# Patient Record
Sex: Female | Born: 1945 | Race: White | Hispanic: No | Marital: Married | Smoking: Never smoker
Health system: Southern US, Community
[De-identification: ages and names within clinical notes are randomized; demographics above are authoritative.]

## PROBLEM LIST (undated history)

## (undated) DIAGNOSIS — E559 Vitamin D deficiency, unspecified: Secondary | ICD-10-CM

## (undated) DIAGNOSIS — K219 Gastro-esophageal reflux disease without esophagitis: Secondary | ICD-10-CM

## (undated) DIAGNOSIS — E039 Hypothyroidism, unspecified: Secondary | ICD-10-CM

## (undated) DIAGNOSIS — R7303 Prediabetes: Secondary | ICD-10-CM

## (undated) DIAGNOSIS — C801 Malignant (primary) neoplasm, unspecified: Secondary | ICD-10-CM

## (undated) DIAGNOSIS — I639 Cerebral infarction, unspecified: Secondary | ICD-10-CM

## (undated) HISTORY — DX: Cerebral infarction, unspecified: I63.9

## (undated) HISTORY — PX: ABDOMINAL HYSTERECTOMY: SHX81

## (undated) HISTORY — PX: TONSILLECTOMY: SUR1361

## (undated) HISTORY — PX: HYSTERECTOMY ABDOMINAL WITH SALPINGO-OOPHORECTOMY: SHX6792

---

## 1997-09-24 ENCOUNTER — Ambulatory Visit (HOSPITAL_COMMUNITY): Admission: RE | Admit: 1997-09-24 | Discharge: 1997-09-24 | Payer: Self-pay | Admitting: Gynecology

## 1997-10-01 ENCOUNTER — Ambulatory Visit: Admission: RE | Admit: 1997-10-01 | Discharge: 1997-10-01 | Payer: Self-pay | Admitting: Gynecology

## 1998-05-01 ENCOUNTER — Ambulatory Visit (HOSPITAL_COMMUNITY): Admission: RE | Admit: 1998-05-01 | Discharge: 1998-05-01 | Payer: Self-pay | Admitting: Gynecology

## 1998-05-05 ENCOUNTER — Ambulatory Visit: Admission: RE | Admit: 1998-05-05 | Discharge: 1998-05-05 | Payer: Self-pay | Admitting: Gynecology

## 1998-09-23 ENCOUNTER — Ambulatory Visit (HOSPITAL_COMMUNITY): Admission: RE | Admit: 1998-09-23 | Discharge: 1998-09-23 | Payer: Self-pay | Admitting: Gastroenterology

## 1999-01-13 ENCOUNTER — Encounter: Admission: RE | Admit: 1999-01-13 | Discharge: 1999-01-13 | Payer: Self-pay | Admitting: Hematology and Oncology

## 1999-01-13 ENCOUNTER — Encounter: Payer: Self-pay | Admitting: Hematology and Oncology

## 1999-02-16 ENCOUNTER — Encounter: Payer: Self-pay | Admitting: Gastroenterology

## 1999-02-16 ENCOUNTER — Encounter: Admission: RE | Admit: 1999-02-16 | Discharge: 1999-02-16 | Payer: Self-pay | Admitting: Gastroenterology

## 1999-07-14 ENCOUNTER — Ambulatory Visit: Admission: RE | Admit: 1999-07-14 | Discharge: 1999-07-14 | Payer: Self-pay | Admitting: Gynecology

## 1999-07-14 ENCOUNTER — Other Ambulatory Visit: Admission: RE | Admit: 1999-07-14 | Discharge: 1999-07-14 | Payer: Self-pay | Admitting: Gynecology

## 1999-09-30 ENCOUNTER — Encounter: Admission: RE | Admit: 1999-09-30 | Discharge: 1999-09-30 | Payer: Self-pay | Admitting: Internal Medicine

## 1999-09-30 ENCOUNTER — Encounter: Payer: Self-pay | Admitting: Internal Medicine

## 2000-01-12 ENCOUNTER — Other Ambulatory Visit: Admission: RE | Admit: 2000-01-12 | Discharge: 2000-01-12 | Payer: Self-pay | Admitting: Gynecology

## 2000-01-12 ENCOUNTER — Ambulatory Visit: Admission: RE | Admit: 2000-01-12 | Discharge: 2000-01-12 | Payer: Self-pay | Admitting: Gynecology

## 2001-03-29 ENCOUNTER — Other Ambulatory Visit: Admission: RE | Admit: 2001-03-29 | Discharge: 2001-03-29 | Payer: Self-pay | Admitting: Obstetrics and Gynecology

## 2001-04-09 ENCOUNTER — Encounter: Payer: Self-pay | Admitting: Obstetrics and Gynecology

## 2001-04-09 ENCOUNTER — Encounter: Admission: RE | Admit: 2001-04-09 | Discharge: 2001-04-09 | Payer: Self-pay | Admitting: Obstetrics and Gynecology

## 2002-04-10 ENCOUNTER — Encounter: Payer: Self-pay | Admitting: Obstetrics and Gynecology

## 2002-04-10 ENCOUNTER — Encounter: Admission: RE | Admit: 2002-04-10 | Discharge: 2002-04-10 | Payer: Self-pay | Admitting: Obstetrics and Gynecology

## 2002-11-23 ENCOUNTER — Emergency Department (HOSPITAL_COMMUNITY): Admission: EM | Admit: 2002-11-23 | Discharge: 2002-11-23 | Payer: Self-pay | Admitting: Emergency Medicine

## 2003-07-03 ENCOUNTER — Emergency Department (HOSPITAL_COMMUNITY): Admission: EM | Admit: 2003-07-03 | Discharge: 2003-07-03 | Payer: Self-pay | Admitting: Family Medicine

## 2004-12-17 ENCOUNTER — Encounter: Admission: RE | Admit: 2004-12-17 | Discharge: 2004-12-17 | Payer: Self-pay | Admitting: Obstetrics and Gynecology

## 2006-02-14 ENCOUNTER — Encounter: Admission: RE | Admit: 2006-02-14 | Discharge: 2006-02-14 | Payer: Self-pay | Admitting: Obstetrics and Gynecology

## 2007-02-19 ENCOUNTER — Encounter: Admission: RE | Admit: 2007-02-19 | Discharge: 2007-02-19 | Payer: Self-pay | Admitting: Obstetrics and Gynecology

## 2007-05-21 ENCOUNTER — Emergency Department (HOSPITAL_COMMUNITY): Admission: EM | Admit: 2007-05-21 | Discharge: 2007-05-21 | Payer: Self-pay | Admitting: Family Medicine

## 2008-02-21 ENCOUNTER — Encounter: Admission: RE | Admit: 2008-02-21 | Discharge: 2008-02-21 | Payer: Self-pay | Admitting: Internal Medicine

## 2008-06-20 ENCOUNTER — Ambulatory Visit (HOSPITAL_COMMUNITY): Admission: RE | Admit: 2008-06-20 | Discharge: 2008-06-20 | Payer: Self-pay | Admitting: Internal Medicine

## 2008-10-30 ENCOUNTER — Ambulatory Visit (HOSPITAL_COMMUNITY): Admission: RE | Admit: 2008-10-30 | Discharge: 2008-10-30 | Payer: Self-pay | Admitting: Gastroenterology

## 2010-02-03 ENCOUNTER — Encounter: Admission: RE | Admit: 2010-02-03 | Discharge: 2010-02-03 | Payer: Self-pay | Admitting: Internal Medicine

## 2010-08-03 NOTE — Op Note (Signed)
Alexandra Powell, Alexandra Powell             ACCOUNT NO.:  [id number]   MEDICAL RECORD NO.:  [id number]          PATIENT TYPE:  AMB   LOCATION:  ENDO                         FACILITY:  [facility]   PHYSICIAN:  Bernette Redbird, M.D.   DATE OF BIRTH:  05/04/1945   DATE OF PROCEDURE:  10/30/2008  DATE OF DISCHARGE:                               OPERATIVE REPORT   PROCEDURE:  Colonoscopy   ENDOSCOPIST:  Bernette Redbird, M.D.   INDICATIONS:  Screening for colon cancer in a 65 year old female with  intermittent bowel obstruction-type symptoms and a family history of  colon cancer in her father at around  age 43.   FINDINGS:  Normal exam to the cecum.   DESCRIPTION OF PROCEDURE:  The nature, purpose and risks of the  procedure were familiar to the patient from a prior examination, and she  provided a written consent.  The Pentax adult video colonoscope was  advanced with some difficulty around the colon, facilitated by turning  the patient into the supine position and applying external abdominal  compression of her ventral hernia.  Ultimately the cecum was reached as  evidenced by visualization of the appendiceal orifice, and pullback was  then performed.  The quality of prep was excellent, and it is felt that  all areas were well seen.   This was a normal examination.  No polyps, cancer, colitis, diverticular  disease, or vascular malformations were noted.  Retroflexion in the  rectum and re-inspection in the rectum were unremarkable.  No biopsies  were obtained.   She tolerated the procedure well and there no apparent complications.   IMPRESSION:  Normal screening colonoscopy in a patient with a family  history of colon cancer.   PLAN:  Repeat colonoscopy in five years.           ______________________________  Bernette Redbird, M.D.     RB/MEDQ  D:  10/30/2008  T:  10/30/2008  Job:  [id number]   cc:   Juline Patch, M.D.  Fax: [phone]

## 2010-08-06 NOTE — Consult Note (Signed)
[facility]  Patient:    Alexandra, Powell                    MRN: [id number] Proc. Date: 01/12/00 Adm. Date:  [id number] Disc. Date: [id number] Attending:  Jeannette Corpus CC:         Telford Nab, R.N.  Florencia Reasons, M.D.  Warrick Parisian, M.D.  Harl Bowie, M.D.   Consultation Report  GYNECOLOGIC ONCOLOGY CLINIC  A 65 year old white female returns for continuing followup.  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  She received chemotherapy for treatment of her ovarian cancer using carboplatin and Taxol.  The patient has done well since completing chemotherapy and has remained without any evidence of disease.  Since her last visit, she has had no GI or GU symptoms except for those attributed to irritable bowel syndrome, managed by Dr. Katy Fitch. Buccini.  She denies any pelvic pressure or any breast symptoms or any cardiovascular or pulmonary symptoms.  REVIEW OF SYSTEMS:  Negative.  FAMILY HISTORY AND SOCIAL HISTORY:  Reviewed and unchanged from previous notations.  PHYSICAL EXAMINATION  VITAL SIGNS:  Weight 255 pounds (stable), blood pressure 108/80.  GENERAL:  The patient is a pleasant white female in no acute distress.  HEENT:  Negative.  NECK:  Supple without thyromegaly.  LYMPHATICS:  There is no supraclavicular, axillary or inguinal adenopathy.  BREASTS:  Without masses, discharge or skin changes.  ABDOMEN:  Obese, soft, nontender.  No masses, organomegaly, ascites or herniae are noted.  Her incision is well-healed.  PELVIC:  EG/BUS normal.  Vagina is clean, well-supported and no lesions are noted.  Bimanual and rectovaginal exam revealed no masses, induration or nodularity.  IMPRESSION:  Stage IIC ovarian cancer, stage IB endometrial cancer, 1996; no evidence of recurrent disease.  PLAN:  Pap smears were  obtained.  CA125 is obtained (return value is 5.4 U/ml).  The patient will return to see me in one year.  It is also noted that she had mammograms recently and they were negative. DD:  01/25/00 TD:  01/25/00 Job: [id number]

## 2012-05-29 DIAGNOSIS — R509 Fever, unspecified: Secondary | ICD-10-CM | POA: Diagnosis not present

## 2012-05-29 DIAGNOSIS — R300 Dysuria: Secondary | ICD-10-CM | POA: Diagnosis not present

## 2012-07-17 DIAGNOSIS — E7800 Pure hypercholesterolemia, unspecified: Secondary | ICD-10-CM | POA: Diagnosis not present

## 2012-12-24 DIAGNOSIS — Z23 Encounter for immunization: Secondary | ICD-10-CM | POA: Diagnosis not present

## 2013-01-17 DIAGNOSIS — E039 Hypothyroidism, unspecified: Secondary | ICD-10-CM | POA: Diagnosis not present

## 2013-01-17 DIAGNOSIS — Z0000 Encounter for general adult medical examination without abnormal findings: Secondary | ICD-10-CM | POA: Diagnosis not present

## 2013-01-17 DIAGNOSIS — E7800 Pure hypercholesterolemia, unspecified: Secondary | ICD-10-CM | POA: Diagnosis not present

## 2013-01-17 DIAGNOSIS — E559 Vitamin D deficiency, unspecified: Secondary | ICD-10-CM | POA: Diagnosis not present

## 2013-01-17 DIAGNOSIS — C569 Malignant neoplasm of unspecified ovary: Secondary | ICD-10-CM | POA: Diagnosis not present

## 2013-01-21 DIAGNOSIS — Z0000 Encounter for general adult medical examination without abnormal findings: Secondary | ICD-10-CM | POA: Diagnosis not present

## 2013-01-21 DIAGNOSIS — M899 Disorder of bone, unspecified: Secondary | ICD-10-CM | POA: Diagnosis not present

## 2013-01-21 DIAGNOSIS — E7800 Pure hypercholesterolemia, unspecified: Secondary | ICD-10-CM | POA: Diagnosis not present

## 2013-01-21 DIAGNOSIS — E559 Vitamin D deficiency, unspecified: Secondary | ICD-10-CM | POA: Diagnosis not present

## 2013-01-21 DIAGNOSIS — Z1331 Encounter for screening for depression: Secondary | ICD-10-CM | POA: Diagnosis not present

## 2013-07-25 DIAGNOSIS — E7800 Pure hypercholesterolemia, unspecified: Secondary | ICD-10-CM | POA: Diagnosis not present

## 2013-08-01 DIAGNOSIS — E039 Hypothyroidism, unspecified: Secondary | ICD-10-CM | POA: Diagnosis not present

## 2013-08-01 DIAGNOSIS — E785 Hyperlipidemia, unspecified: Secondary | ICD-10-CM | POA: Diagnosis not present

## 2013-08-01 DIAGNOSIS — Z0000 Encounter for general adult medical examination without abnormal findings: Secondary | ICD-10-CM | POA: Diagnosis not present

## 2013-08-01 DIAGNOSIS — Z1331 Encounter for screening for depression: Secondary | ICD-10-CM | POA: Diagnosis not present

## 2013-11-14 DIAGNOSIS — H1045 Other chronic allergic conjunctivitis: Secondary | ICD-10-CM | POA: Diagnosis not present

## 2013-11-14 DIAGNOSIS — H04129 Dry eye syndrome of unspecified lacrimal gland: Secondary | ICD-10-CM | POA: Diagnosis not present

## 2013-11-14 DIAGNOSIS — H2510 Age-related nuclear cataract, unspecified eye: Secondary | ICD-10-CM | POA: Diagnosis not present

## 2014-02-18 DIAGNOSIS — E559 Vitamin D deficiency, unspecified: Secondary | ICD-10-CM | POA: Diagnosis not present

## 2014-02-18 DIAGNOSIS — E785 Hyperlipidemia, unspecified: Secondary | ICD-10-CM | POA: Diagnosis not present

## 2014-02-18 DIAGNOSIS — C569 Malignant neoplasm of unspecified ovary: Secondary | ICD-10-CM | POA: Diagnosis not present

## 2014-02-18 DIAGNOSIS — E039 Hypothyroidism, unspecified: Secondary | ICD-10-CM | POA: Diagnosis not present

## 2014-02-18 DIAGNOSIS — E780 Pure hypercholesterolemia: Secondary | ICD-10-CM | POA: Diagnosis not present

## 2014-03-05 DIAGNOSIS — K219 Gastro-esophageal reflux disease without esophagitis: Secondary | ICD-10-CM | POA: Diagnosis not present

## 2014-03-05 DIAGNOSIS — E780 Pure hypercholesterolemia: Secondary | ICD-10-CM | POA: Diagnosis not present

## 2014-03-05 DIAGNOSIS — E039 Hypothyroidism, unspecified: Secondary | ICD-10-CM | POA: Diagnosis not present

## 2014-03-05 DIAGNOSIS — E6609 Other obesity due to excess calories: Secondary | ICD-10-CM | POA: Diagnosis not present

## 2014-03-23 DIAGNOSIS — J019 Acute sinusitis, unspecified: Secondary | ICD-10-CM | POA: Diagnosis not present

## 2014-09-15 DIAGNOSIS — K219 Gastro-esophageal reflux disease without esophagitis: Secondary | ICD-10-CM | POA: Diagnosis not present

## 2014-09-15 DIAGNOSIS — E780 Pure hypercholesterolemia: Secondary | ICD-10-CM | POA: Diagnosis not present

## 2014-09-15 DIAGNOSIS — E039 Hypothyroidism, unspecified: Secondary | ICD-10-CM | POA: Diagnosis not present

## 2014-09-15 DIAGNOSIS — E785 Hyperlipidemia, unspecified: Secondary | ICD-10-CM | POA: Diagnosis not present

## 2014-09-15 DIAGNOSIS — E559 Vitamin D deficiency, unspecified: Secondary | ICD-10-CM | POA: Diagnosis not present

## 2015-01-08 ENCOUNTER — Ambulatory Visit (HOSPITAL_COMMUNITY)
Admission: RE | Admit: 2015-01-08 | Discharge: 2015-01-08 | Disposition: A | Discharge status: Home / Self Care | Payer: Medicare Other | Source: Ambulatory Visit | Attending: Gastroenterology | Admitting: Gastroenterology

## 2015-01-08 ENCOUNTER — Other Ambulatory Visit (HOSPITAL_COMMUNITY): Payer: Self-pay | Admitting: Gastroenterology

## 2015-01-08 DIAGNOSIS — K429 Umbilical hernia without obstruction or gangrene: Secondary | ICD-10-CM | POA: Insufficient documentation

## 2015-01-08 DIAGNOSIS — Z1211 Encounter for screening for malignant neoplasm of colon: Secondary | ICD-10-CM | POA: Diagnosis not present

## 2015-01-08 DIAGNOSIS — K5669 Other intestinal obstruction: Secondary | ICD-10-CM | POA: Diagnosis not present

## 2015-01-08 DIAGNOSIS — Z800 Family history of malignant neoplasm of digestive organs: Secondary | ICD-10-CM

## 2015-01-08 DIAGNOSIS — Z538 Procedure and treatment not carried out for other reasons: Secondary | ICD-10-CM | POA: Diagnosis not present

## 2015-03-10 DIAGNOSIS — E785 Hyperlipidemia, unspecified: Secondary | ICD-10-CM | POA: Diagnosis not present

## 2015-03-10 DIAGNOSIS — E559 Vitamin D deficiency, unspecified: Secondary | ICD-10-CM | POA: Diagnosis not present

## 2015-03-10 DIAGNOSIS — Z8543 Personal history of malignant neoplasm of ovary: Secondary | ICD-10-CM | POA: Diagnosis not present

## 2015-03-10 DIAGNOSIS — Z13820 Encounter for screening for osteoporosis: Secondary | ICD-10-CM | POA: Diagnosis not present

## 2015-03-10 DIAGNOSIS — Z23 Encounter for immunization: Secondary | ICD-10-CM | POA: Diagnosis not present

## 2015-03-10 DIAGNOSIS — E039 Hypothyroidism, unspecified: Secondary | ICD-10-CM | POA: Diagnosis not present

## 2015-03-10 DIAGNOSIS — K219 Gastro-esophageal reflux disease without esophagitis: Secondary | ICD-10-CM | POA: Diagnosis not present

## 2015-03-10 DIAGNOSIS — Z0000 Encounter for general adult medical examination without abnormal findings: Secondary | ICD-10-CM | POA: Diagnosis not present

## 2015-03-17 DIAGNOSIS — E785 Hyperlipidemia, unspecified: Secondary | ICD-10-CM | POA: Diagnosis not present

## 2015-03-17 DIAGNOSIS — E039 Hypothyroidism, unspecified: Secondary | ICD-10-CM | POA: Diagnosis not present

## 2015-03-17 DIAGNOSIS — E559 Vitamin D deficiency, unspecified: Secondary | ICD-10-CM | POA: Diagnosis not present

## 2015-03-17 DIAGNOSIS — N182 Chronic kidney disease, stage 2 (mild): Secondary | ICD-10-CM | POA: Diagnosis not present

## 2015-05-11 ENCOUNTER — Other Ambulatory Visit: Payer: Self-pay

## 2015-05-11 DIAGNOSIS — Z1231 Encounter for screening mammogram for malignant neoplasm of breast: Secondary | ICD-10-CM

## 2015-05-26 ENCOUNTER — Ambulatory Visit
Admission: RE | Admit: 2015-05-26 | Discharge: 2015-05-26 | Disposition: A | Discharge status: Home / Self Care | Payer: Medicare Other | Source: Ambulatory Visit

## 2015-05-26 DIAGNOSIS — Z1231 Encounter for screening mammogram for malignant neoplasm of breast: Secondary | ICD-10-CM

## 2015-09-08 DIAGNOSIS — E785 Hyperlipidemia, unspecified: Secondary | ICD-10-CM | POA: Diagnosis not present

## 2015-09-08 DIAGNOSIS — E559 Vitamin D deficiency, unspecified: Secondary | ICD-10-CM | POA: Diagnosis not present

## 2015-09-08 DIAGNOSIS — E039 Hypothyroidism, unspecified: Secondary | ICD-10-CM | POA: Diagnosis not present

## 2015-09-08 DIAGNOSIS — N182 Chronic kidney disease, stage 2 (mild): Secondary | ICD-10-CM | POA: Diagnosis not present

## 2015-09-10 DIAGNOSIS — M1711 Unilateral primary osteoarthritis, right knee: Secondary | ICD-10-CM | POA: Diagnosis not present

## 2015-09-15 DIAGNOSIS — E039 Hypothyroidism, unspecified: Secondary | ICD-10-CM | POA: Diagnosis not present

## 2015-09-15 DIAGNOSIS — N182 Chronic kidney disease, stage 2 (mild): Secondary | ICD-10-CM | POA: Diagnosis not present

## 2015-09-15 DIAGNOSIS — E785 Hyperlipidemia, unspecified: Secondary | ICD-10-CM | POA: Diagnosis not present

## 2016-01-12 DIAGNOSIS — H04123 Dry eye syndrome of bilateral lacrimal glands: Secondary | ICD-10-CM | POA: Diagnosis not present

## 2016-01-12 DIAGNOSIS — H25813 Combined forms of age-related cataract, bilateral: Secondary | ICD-10-CM | POA: Diagnosis not present

## 2016-02-23 DIAGNOSIS — M545 Low back pain: Secondary | ICD-10-CM | POA: Diagnosis not present

## 2016-02-23 DIAGNOSIS — M7072 Other bursitis of hip, left hip: Secondary | ICD-10-CM | POA: Diagnosis not present

## 2016-03-10 DIAGNOSIS — E039 Hypothyroidism, unspecified: Secondary | ICD-10-CM | POA: Diagnosis not present

## 2016-03-10 DIAGNOSIS — M8580 Other specified disorders of bone density and structure, unspecified site: Secondary | ICD-10-CM | POA: Diagnosis not present

## 2016-03-10 DIAGNOSIS — E785 Hyperlipidemia, unspecified: Secondary | ICD-10-CM | POA: Diagnosis not present

## 2016-03-10 DIAGNOSIS — Z8543 Personal history of malignant neoplasm of ovary: Secondary | ICD-10-CM | POA: Diagnosis not present

## 2016-03-10 DIAGNOSIS — N390 Urinary tract infection, site not specified: Secondary | ICD-10-CM | POA: Diagnosis not present

## 2016-03-22 DIAGNOSIS — N182 Chronic kidney disease, stage 2 (mild): Secondary | ICD-10-CM | POA: Diagnosis not present

## 2016-03-22 DIAGNOSIS — M8580 Other specified disorders of bone density and structure, unspecified site: Secondary | ICD-10-CM | POA: Diagnosis not present

## 2016-03-22 DIAGNOSIS — E785 Hyperlipidemia, unspecified: Secondary | ICD-10-CM | POA: Diagnosis not present

## 2016-03-22 DIAGNOSIS — E039 Hypothyroidism, unspecified: Secondary | ICD-10-CM | POA: Diagnosis not present

## 2016-05-25 ENCOUNTER — Emergency Department (HOSPITAL_COMMUNITY)
Admission: EM | Admit: 2016-05-25 | Discharge: 2016-05-25 | Disposition: A | Discharge status: Home / Self Care | Payer: Medicare Other | Attending: Emergency Medicine | Admitting: Emergency Medicine

## 2016-05-25 ENCOUNTER — Encounter (HOSPITAL_COMMUNITY): Payer: Self-pay | Admitting: Emergency Medicine

## 2016-05-25 ENCOUNTER — Emergency Department (HOSPITAL_COMMUNITY): Payer: Medicare Other

## 2016-05-25 DIAGNOSIS — M25511 Pain in right shoulder: Secondary | ICD-10-CM | POA: Diagnosis present

## 2016-05-25 DIAGNOSIS — W19XXXA Unspecified fall, initial encounter: Secondary | ICD-10-CM | POA: Diagnosis not present

## 2016-05-25 DIAGNOSIS — Y999 Unspecified external cause status: Secondary | ICD-10-CM | POA: Insufficient documentation

## 2016-05-25 DIAGNOSIS — Y92009 Unspecified place in unspecified non-institutional (private) residence as the place of occurrence of the external cause: Secondary | ICD-10-CM | POA: Insufficient documentation

## 2016-05-25 DIAGNOSIS — Y9389 Activity, other specified: Secondary | ICD-10-CM | POA: Diagnosis not present

## 2016-05-25 HISTORY — DX: Vitamin D deficiency, unspecified: E55.9

## 2016-05-25 HISTORY — DX: Gastro-esophageal reflux disease without esophagitis: K21.9

## 2016-05-25 MED ORDER — IBUPROFEN 600 MG PO TABS: 600.0000 mg | ORAL_TABLET | Freq: Four times a day (QID) | ORAL | 0 refills | Status: DC | PRN

## 2016-05-25 NOTE — ED Provider Notes (Signed)
[facility] DEPT Provider Note   CSN: [id number] Arrival date & time: 05/25/16  1759     History   Chief Complaint Chief Complaint  Patient presents with  . Shoulder Injury  . Shoulder Pain  . Fall    HPI Alexandra Powell is a 71 y.o. female.  HPI   Patient is a 71 year old female with history of GERD and thyroid disease who presents with complaint of right shoulder pain, onset 5:30 PM. Patient reports she was stepping backwards after placing something in a closet at home when she stumbled over her feet resulting in her falling on her right shoulder on the hardwood floor. Denies head injury or LOC. Patient reports having associated pain to her right shoulder that worsens with any movement. Denies radiation. Denies headache, dizziness, neck pain, back pain, chest pain, abdominal pain, numbness, tingling, weakness. Patient denies taking any medications for her symptoms. Denies use of anticoagulants. Denies prior injury or surgery to her right shoulder.  Past Medical History:  Diagnosis Date  . GERD (gastroesophageal reflux disease)   . Thyroid disease   . Vitamin D deficiency     There are no active problems to display for this patient.   History reviewed. No pertinent surgical history.  OB History    No data available       Home Medications    Prior to Admission medications   Medication Sig Start Date End Date Taking? Authorizing Provider  ibuprofen (ADVIL,MOTRIN) 600 MG tablet Take 1 tablet (600 mg total) by mouth every 6 (six) hours as needed. 05/25/16   Nona Dell, PA-C    Family History No family history on file.  Social History Social History  Substance Use Topics  . Smoking status: Never Smoker  . Smokeless tobacco: Never Used  . Alcohol use No     Allergies   Patient has no allergy information on record.   Review of Systems Review of Systems  Musculoskeletal: Positive for arthralgias (right shoulder).  All other systems reviewed  and are negative.    Physical Exam Updated Vital Signs BP 158/84   Pulse 76   Temp 98.6 F (37 C) (Oral)   Resp 17   Ht 5\' 2"  (1.575 m)   Wt 105.2 kg   SpO2 100%   BMI 42.43 kg/m   Physical Exam  Constitutional: She is oriented to person, place, and time. She appears well-developed and well-nourished. No distress.  HENT:  Head: Normocephalic and atraumatic. Head is without raccoon's eyes, without Battle's sign, without abrasion and without laceration.  Right Ear: Tympanic membrane normal. No hemotympanum.  Left Ear: Tympanic membrane normal. No hemotympanum.  Nose: Nose normal. No sinus tenderness, nasal deformity, septal deviation or nasal septal hematoma. No epistaxis.  Mouth/Throat: Uvula is midline, oropharynx is clear and moist and mucous membranes are normal. No oropharyngeal exudate, posterior oropharyngeal edema, posterior oropharyngeal erythema or tonsillar abscesses.  Eyes: Conjunctivae and EOM are normal. Pupils are equal, round, and reactive to light. Right eye exhibits no discharge. Left eye exhibits no discharge. No scleral icterus.  Neck: Normal range of motion. Neck supple.  Cardiovascular: Normal rate, regular rhythm, normal heart sounds and intact distal pulses.   Pulmonary/Chest: Effort normal and breath sounds normal. No respiratory distress. She has no wheezes. She has no rales. She exhibits no tenderness.  Abdominal: Soft. Bowel sounds are normal. She exhibits no distension and no mass. There is no tenderness. There is no rebound and no guarding.  Musculoskeletal: She exhibits  tenderness. She exhibits no edema or deformity.       Right shoulder: She exhibits decreased range of motion, tenderness and decreased strength (due to pain). She exhibits no swelling, no effusion, no crepitus, no deformity, no laceration and normal pulse.  No cervical, thoracic, or lumbar spine midline TTP.  Full ROM of left upper and bilateral lower extremities with 5/5 strength.   Diffuse TTP over right deltoid. Dec strength and ROM of right shoulder due to reported pain. FROM and 5/5 strength to right elbow, forearm, wrist, and hand. No TTP or palpable deformity noted to bilateral clavicles. Equal grip strength bilaterally. 2+ radial and PT pulses. Sensation grossly intact.   Neurological: She is alert and oriented to person, place, and time. She has normal strength. No cranial nerve deficit or sensory deficit. Coordination and gait normal.  Skin: Skin is warm and dry. She is not diaphoretic.  Nursing note and vitals reviewed.    ED Treatments / Results  Labs (all labs ordered are listed, but only abnormal results are displayed) Labs Reviewed - No data to display  EKG  EKG Interpretation None       Radiology Dg Shoulder Right  Result Date: 05/25/2016 CLINICAL DATA:  Right shoulder pain after fall EXAM: RIGHT SHOULDER - 2+ VIEW COMPARISON:  None. FINDINGS: There is no evidence of fracture or dislocation. Small osteophyte off the inferior rim of the noted, consistent osteoarthritis. No loose bodies are noted. The adjacent ribs and lung are unremarkable. Partially visualized cervical facet arthropathy. Soft tissues are unremarkable. IMPRESSION: No acute fracture nor dislocation of the right shoulder and AC joint. Osteophytes off the inferior rim of the glenoid likely degenerative in etiology. Electronically Signed   By: Ashley Royalty M.D.   On: 05/25/2016 19:48    Procedures Procedures (including critical care time)  Medications Ordered in ED Medications - No data to display   Initial Impression / Assessment and Plan / ED Course  I have reviewed the triage vital signs and the nursing notes.  Pertinent labs & imaging results that were available during my care of the patient were reviewed by me and considered in my medical decision making (see chart for details).     Patient X-Ray negative for obvious fracture or dislocation. Pain managed in ED. Pt advised to  follow up with orthopedics if symptoms persist for possibility of rotator cuff injury/ligament injury. Patient given sling while in ED and advised to only use for comfort and not all the time to prevent frozen shoulder. Conservative therapy recommended and discussed. Patient will be dc home & is agreeable with above plan. Discussed return precautions.  Final Clinical Impressions(s) / ED Diagnoses   Final diagnoses:  Acute pain of right shoulder    New Prescriptions New Prescriptions   IBUPROFEN (ADVIL,MOTRIN) 600 MG TABLET    Take 1 tablet (600 mg total) by mouth every 6 (six) hours as needed.     Chesley [place], [place] 05/25/16 2331    Charlesetta Shanks, MD 05/29/16 [phone]

## 2016-05-25 NOTE — ED Triage Notes (Signed)
Pt. Stated, I was putting stuff up in the pantry and I fell and tripped on my own feet.  My rt. Shoulder has been hurting ever since.

## 2016-05-25 NOTE — Discharge Instructions (Signed)
Take your medication as prescribed for pain relief. I also recommend applying ice to affected area for 15-20 minutes 3-4 times daily for pain relief. I also recommend doing the shoulder stretches/exercises we discussed in the emergency department 3-4 times daily to help prevent frozen shoulder.  Follow up with your orthopedist in the next week if your symptoms have not improved due to concern for possible rotator cuff tear/ligament injury. Please return to the Emergency Department if symptoms worsen or new onset of redness, swelling, numbness, weakness, headache, neck stiffness, neck/back pain.

## 2016-05-25 NOTE — ED Notes (Signed)
Pt stable, understands discharge instructions, and reasons for return.   

## 2016-06-07 DIAGNOSIS — M25511 Pain in right shoulder: Secondary | ICD-10-CM | POA: Diagnosis not present

## 2016-06-10 ENCOUNTER — Other Ambulatory Visit: Payer: Self-pay | Admitting: Orthopedic Surgery

## 2016-06-10 DIAGNOSIS — M25511 Pain in right shoulder: Secondary | ICD-10-CM

## 2016-06-23 ENCOUNTER — Ambulatory Visit
Admission: RE | Admit: 2016-06-23 | Discharge: 2016-06-23 | Disposition: A | Discharge status: Home / Self Care | Payer: Medicare Other | Source: Ambulatory Visit | Attending: Orthopedic Surgery | Admitting: Orthopedic Surgery

## 2016-06-23 DIAGNOSIS — M25511 Pain in right shoulder: Secondary | ICD-10-CM | POA: Diagnosis not present

## 2016-06-27 ENCOUNTER — Other Ambulatory Visit: Payer: Self-pay | Admitting: Orthopedic Surgery

## 2016-06-27 DIAGNOSIS — M25511 Pain in right shoulder: Secondary | ICD-10-CM

## 2016-06-29 ENCOUNTER — Ambulatory Visit
Admission: RE | Admit: 2016-06-29 | Discharge: 2016-06-29 | Disposition: A | Discharge status: Home / Self Care | Payer: Medicare Other | Source: Ambulatory Visit | Attending: Orthopedic Surgery | Admitting: Orthopedic Surgery

## 2016-06-29 DIAGNOSIS — S42141D Displaced fracture of glenoid cavity of scapula, right shoulder, subsequent encounter for fracture with routine healing: Secondary | ICD-10-CM | POA: Diagnosis not present

## 2016-06-29 DIAGNOSIS — M25511 Pain in right shoulder: Secondary | ICD-10-CM

## 2016-07-04 DIAGNOSIS — S42144A Nondisplaced fracture of glenoid cavity of scapula, right shoulder, initial encounter for closed fracture: Secondary | ICD-10-CM | POA: Diagnosis not present

## 2016-07-06 DIAGNOSIS — M25511 Pain in right shoulder: Secondary | ICD-10-CM | POA: Diagnosis not present

## 2016-07-06 DIAGNOSIS — M25611 Stiffness of right shoulder, not elsewhere classified: Secondary | ICD-10-CM | POA: Diagnosis not present

## 2016-07-06 DIAGNOSIS — S42144D Nondisplaced fracture of glenoid cavity of scapula, right shoulder, subsequent encounter for fracture with routine healing: Secondary | ICD-10-CM | POA: Diagnosis not present

## 2016-07-11 DIAGNOSIS — M25511 Pain in right shoulder: Secondary | ICD-10-CM | POA: Diagnosis not present

## 2016-07-11 DIAGNOSIS — M25611 Stiffness of right shoulder, not elsewhere classified: Secondary | ICD-10-CM | POA: Diagnosis not present

## 2016-07-11 DIAGNOSIS — S42144D Nondisplaced fracture of glenoid cavity of scapula, right shoulder, subsequent encounter for fracture with routine healing: Secondary | ICD-10-CM | POA: Diagnosis not present

## 2016-07-14 DIAGNOSIS — M25611 Stiffness of right shoulder, not elsewhere classified: Secondary | ICD-10-CM | POA: Diagnosis not present

## 2016-07-14 DIAGNOSIS — S42144D Nondisplaced fracture of glenoid cavity of scapula, right shoulder, subsequent encounter for fracture with routine healing: Secondary | ICD-10-CM | POA: Diagnosis not present

## 2016-07-14 DIAGNOSIS — M25511 Pain in right shoulder: Secondary | ICD-10-CM | POA: Diagnosis not present

## 2016-07-18 DIAGNOSIS — S42144D Nondisplaced fracture of glenoid cavity of scapula, right shoulder, subsequent encounter for fracture with routine healing: Secondary | ICD-10-CM | POA: Diagnosis not present

## 2016-07-18 DIAGNOSIS — M25611 Stiffness of right shoulder, not elsewhere classified: Secondary | ICD-10-CM | POA: Diagnosis not present

## 2016-07-18 DIAGNOSIS — M25511 Pain in right shoulder: Secondary | ICD-10-CM | POA: Diagnosis not present

## 2016-07-21 DIAGNOSIS — S42144D Nondisplaced fracture of glenoid cavity of scapula, right shoulder, subsequent encounter for fracture with routine healing: Secondary | ICD-10-CM | POA: Diagnosis not present

## 2016-07-21 DIAGNOSIS — M25511 Pain in right shoulder: Secondary | ICD-10-CM | POA: Diagnosis not present

## 2016-07-21 DIAGNOSIS — M25611 Stiffness of right shoulder, not elsewhere classified: Secondary | ICD-10-CM | POA: Diagnosis not present

## 2016-07-27 DIAGNOSIS — M25611 Stiffness of right shoulder, not elsewhere classified: Secondary | ICD-10-CM | POA: Diagnosis not present

## 2016-07-27 DIAGNOSIS — M25511 Pain in right shoulder: Secondary | ICD-10-CM | POA: Diagnosis not present

## 2016-07-27 DIAGNOSIS — S42144D Nondisplaced fracture of glenoid cavity of scapula, right shoulder, subsequent encounter for fracture with routine healing: Secondary | ICD-10-CM | POA: Diagnosis not present

## 2016-07-29 DIAGNOSIS — M25511 Pain in right shoulder: Secondary | ICD-10-CM | POA: Diagnosis not present

## 2016-07-29 DIAGNOSIS — S42144D Nondisplaced fracture of glenoid cavity of scapula, right shoulder, subsequent encounter for fracture with routine healing: Secondary | ICD-10-CM | POA: Diagnosis not present

## 2016-07-29 DIAGNOSIS — M25611 Stiffness of right shoulder, not elsewhere classified: Secondary | ICD-10-CM | POA: Diagnosis not present

## 2016-08-01 DIAGNOSIS — S42144D Nondisplaced fracture of glenoid cavity of scapula, right shoulder, subsequent encounter for fracture with routine healing: Secondary | ICD-10-CM | POA: Diagnosis not present

## 2016-08-01 DIAGNOSIS — M1712 Unilateral primary osteoarthritis, left knee: Secondary | ICD-10-CM | POA: Diagnosis not present

## 2016-08-03 DIAGNOSIS — M25511 Pain in right shoulder: Secondary | ICD-10-CM | POA: Diagnosis not present

## 2016-08-03 DIAGNOSIS — S42144D Nondisplaced fracture of glenoid cavity of scapula, right shoulder, subsequent encounter for fracture with routine healing: Secondary | ICD-10-CM | POA: Diagnosis not present

## 2016-08-03 DIAGNOSIS — M25611 Stiffness of right shoulder, not elsewhere classified: Secondary | ICD-10-CM | POA: Diagnosis not present

## 2016-08-09 DIAGNOSIS — S42144D Nondisplaced fracture of glenoid cavity of scapula, right shoulder, subsequent encounter for fracture with routine healing: Secondary | ICD-10-CM | POA: Diagnosis not present

## 2016-08-09 DIAGNOSIS — M25611 Stiffness of right shoulder, not elsewhere classified: Secondary | ICD-10-CM | POA: Diagnosis not present

## 2016-08-09 DIAGNOSIS — M25511 Pain in right shoulder: Secondary | ICD-10-CM | POA: Diagnosis not present

## 2016-08-11 DIAGNOSIS — M25611 Stiffness of right shoulder, not elsewhere classified: Secondary | ICD-10-CM | POA: Diagnosis not present

## 2016-08-11 DIAGNOSIS — M25511 Pain in right shoulder: Secondary | ICD-10-CM | POA: Diagnosis not present

## 2016-08-11 DIAGNOSIS — S42144D Nondisplaced fracture of glenoid cavity of scapula, right shoulder, subsequent encounter for fracture with routine healing: Secondary | ICD-10-CM | POA: Diagnosis not present

## 2016-08-16 DIAGNOSIS — S42144D Nondisplaced fracture of glenoid cavity of scapula, right shoulder, subsequent encounter for fracture with routine healing: Secondary | ICD-10-CM | POA: Diagnosis not present

## 2016-08-16 DIAGNOSIS — M25611 Stiffness of right shoulder, not elsewhere classified: Secondary | ICD-10-CM | POA: Diagnosis not present

## 2016-08-16 DIAGNOSIS — M25511 Pain in right shoulder: Secondary | ICD-10-CM | POA: Diagnosis not present

## 2016-08-18 DIAGNOSIS — M25511 Pain in right shoulder: Secondary | ICD-10-CM | POA: Diagnosis not present

## 2016-08-18 DIAGNOSIS — S42144D Nondisplaced fracture of glenoid cavity of scapula, right shoulder, subsequent encounter for fracture with routine healing: Secondary | ICD-10-CM | POA: Diagnosis not present

## 2016-08-18 DIAGNOSIS — M25611 Stiffness of right shoulder, not elsewhere classified: Secondary | ICD-10-CM | POA: Diagnosis not present

## 2016-08-23 DIAGNOSIS — S42144D Nondisplaced fracture of glenoid cavity of scapula, right shoulder, subsequent encounter for fracture with routine healing: Secondary | ICD-10-CM | POA: Diagnosis not present

## 2016-08-23 DIAGNOSIS — M25611 Stiffness of right shoulder, not elsewhere classified: Secondary | ICD-10-CM | POA: Diagnosis not present

## 2016-08-23 DIAGNOSIS — M25511 Pain in right shoulder: Secondary | ICD-10-CM | POA: Diagnosis not present

## 2016-08-25 DIAGNOSIS — S42144D Nondisplaced fracture of glenoid cavity of scapula, right shoulder, subsequent encounter for fracture with routine healing: Secondary | ICD-10-CM | POA: Diagnosis not present

## 2016-08-25 DIAGNOSIS — M25511 Pain in right shoulder: Secondary | ICD-10-CM | POA: Diagnosis not present

## 2016-08-25 DIAGNOSIS — M25611 Stiffness of right shoulder, not elsewhere classified: Secondary | ICD-10-CM | POA: Diagnosis not present

## 2016-08-29 DIAGNOSIS — S42144D Nondisplaced fracture of glenoid cavity of scapula, right shoulder, subsequent encounter for fracture with routine healing: Secondary | ICD-10-CM | POA: Diagnosis not present

## 2016-08-29 DIAGNOSIS — M1712 Unilateral primary osteoarthritis, left knee: Secondary | ICD-10-CM | POA: Diagnosis not present

## 2016-08-31 DIAGNOSIS — S42144D Nondisplaced fracture of glenoid cavity of scapula, right shoulder, subsequent encounter for fracture with routine healing: Secondary | ICD-10-CM | POA: Diagnosis not present

## 2016-08-31 DIAGNOSIS — M25511 Pain in right shoulder: Secondary | ICD-10-CM | POA: Diagnosis not present

## 2016-08-31 DIAGNOSIS — M25611 Stiffness of right shoulder, not elsewhere classified: Secondary | ICD-10-CM | POA: Diagnosis not present

## 2016-09-02 DIAGNOSIS — S42144D Nondisplaced fracture of glenoid cavity of scapula, right shoulder, subsequent encounter for fracture with routine healing: Secondary | ICD-10-CM | POA: Diagnosis not present

## 2016-09-02 DIAGNOSIS — M25511 Pain in right shoulder: Secondary | ICD-10-CM | POA: Diagnosis not present

## 2016-09-02 DIAGNOSIS — M25611 Stiffness of right shoulder, not elsewhere classified: Secondary | ICD-10-CM | POA: Diagnosis not present

## 2016-09-06 DIAGNOSIS — M25611 Stiffness of right shoulder, not elsewhere classified: Secondary | ICD-10-CM | POA: Diagnosis not present

## 2016-09-06 DIAGNOSIS — M25511 Pain in right shoulder: Secondary | ICD-10-CM | POA: Diagnosis not present

## 2016-09-06 DIAGNOSIS — S42144D Nondisplaced fracture of glenoid cavity of scapula, right shoulder, subsequent encounter for fracture with routine healing: Secondary | ICD-10-CM | POA: Diagnosis not present

## 2016-09-08 DIAGNOSIS — M25611 Stiffness of right shoulder, not elsewhere classified: Secondary | ICD-10-CM | POA: Diagnosis not present

## 2016-09-08 DIAGNOSIS — M25511 Pain in right shoulder: Secondary | ICD-10-CM | POA: Diagnosis not present

## 2016-09-08 DIAGNOSIS — S42144D Nondisplaced fracture of glenoid cavity of scapula, right shoulder, subsequent encounter for fracture with routine healing: Secondary | ICD-10-CM | POA: Diagnosis not present

## 2016-09-12 DIAGNOSIS — M8580 Other specified disorders of bone density and structure, unspecified site: Secondary | ICD-10-CM | POA: Diagnosis not present

## 2016-09-12 DIAGNOSIS — E039 Hypothyroidism, unspecified: Secondary | ICD-10-CM | POA: Diagnosis not present

## 2016-09-12 DIAGNOSIS — N182 Chronic kidney disease, stage 2 (mild): Secondary | ICD-10-CM | POA: Diagnosis not present

## 2016-09-12 DIAGNOSIS — E785 Hyperlipidemia, unspecified: Secondary | ICD-10-CM | POA: Diagnosis not present

## 2016-09-12 DIAGNOSIS — E559 Vitamin D deficiency, unspecified: Secondary | ICD-10-CM | POA: Diagnosis not present

## 2016-09-19 DIAGNOSIS — E785 Hyperlipidemia, unspecified: Secondary | ICD-10-CM | POA: Diagnosis not present

## 2016-09-19 DIAGNOSIS — N182 Chronic kidney disease, stage 2 (mild): Secondary | ICD-10-CM | POA: Diagnosis not present

## 2016-09-19 DIAGNOSIS — K219 Gastro-esophageal reflux disease without esophagitis: Secondary | ICD-10-CM | POA: Diagnosis not present

## 2016-09-19 DIAGNOSIS — E039 Hypothyroidism, unspecified: Secondary | ICD-10-CM | POA: Diagnosis not present

## 2017-01-13 DIAGNOSIS — H25813 Combined forms of age-related cataract, bilateral: Secondary | ICD-10-CM | POA: Diagnosis not present

## 2017-01-13 DIAGNOSIS — H04123 Dry eye syndrome of bilateral lacrimal glands: Secondary | ICD-10-CM | POA: Diagnosis not present

## 2017-02-03 DIAGNOSIS — Z23 Encounter for immunization: Secondary | ICD-10-CM | POA: Diagnosis not present

## 2017-03-16 DIAGNOSIS — E559 Vitamin D deficiency, unspecified: Secondary | ICD-10-CM | POA: Diagnosis not present

## 2017-03-16 DIAGNOSIS — E039 Hypothyroidism, unspecified: Secondary | ICD-10-CM | POA: Diagnosis not present

## 2017-03-16 DIAGNOSIS — E785 Hyperlipidemia, unspecified: Secondary | ICD-10-CM | POA: Diagnosis not present

## 2017-03-16 DIAGNOSIS — Z0000 Encounter for general adult medical examination without abnormal findings: Secondary | ICD-10-CM | POA: Diagnosis not present

## 2017-03-16 DIAGNOSIS — N390 Urinary tract infection, site not specified: Secondary | ICD-10-CM | POA: Diagnosis not present

## 2017-03-16 DIAGNOSIS — Z8543 Personal history of malignant neoplasm of ovary: Secondary | ICD-10-CM | POA: Diagnosis not present

## 2017-03-23 DIAGNOSIS — L820 Inflamed seborrheic keratosis: Secondary | ICD-10-CM | POA: Diagnosis not present

## 2017-03-23 DIAGNOSIS — E669 Obesity, unspecified: Secondary | ICD-10-CM | POA: Diagnosis not present

## 2017-03-23 DIAGNOSIS — M859 Disorder of bone density and structure, unspecified: Secondary | ICD-10-CM | POA: Diagnosis not present

## 2017-03-23 DIAGNOSIS — E7800 Pure hypercholesterolemia, unspecified: Secondary | ICD-10-CM | POA: Diagnosis not present

## 2017-03-23 DIAGNOSIS — R7301 Impaired fasting glucose: Secondary | ICD-10-CM | POA: Diagnosis not present

## 2017-03-23 DIAGNOSIS — M8580 Other specified disorders of bone density and structure, unspecified site: Secondary | ICD-10-CM | POA: Diagnosis not present

## 2017-03-23 DIAGNOSIS — E039 Hypothyroidism, unspecified: Secondary | ICD-10-CM | POA: Diagnosis not present

## 2017-03-23 DIAGNOSIS — E559 Vitamin D deficiency, unspecified: Secondary | ICD-10-CM | POA: Diagnosis not present

## 2017-03-23 DIAGNOSIS — K219 Gastro-esophageal reflux disease without esophagitis: Secondary | ICD-10-CM | POA: Diagnosis not present

## 2017-03-27 ENCOUNTER — Other Ambulatory Visit: Payer: Self-pay | Admitting: Internal Medicine

## 2017-03-27 DIAGNOSIS — Z139 Encounter for screening, unspecified: Secondary | ICD-10-CM

## 2017-04-18 ENCOUNTER — Ambulatory Visit
Admission: RE | Admit: 2017-04-18 | Discharge: 2017-04-18 | Disposition: A | Discharge status: Home / Self Care | Payer: Medicare Other | Source: Ambulatory Visit | Attending: Internal Medicine | Admitting: Internal Medicine

## 2017-04-18 DIAGNOSIS — Z139 Encounter for screening, unspecified: Secondary | ICD-10-CM

## 2017-04-18 DIAGNOSIS — Z1231 Encounter for screening mammogram for malignant neoplasm of breast: Secondary | ICD-10-CM | POA: Diagnosis not present

## 2017-05-02 DIAGNOSIS — Z8543 Personal history of malignant neoplasm of ovary: Secondary | ICD-10-CM | POA: Diagnosis not present

## 2017-05-02 DIAGNOSIS — Z800 Family history of malignant neoplasm of digestive organs: Secondary | ICD-10-CM | POA: Diagnosis not present

## 2017-05-02 DIAGNOSIS — Z801 Family history of malignant neoplasm of trachea, bronchus and lung: Secondary | ICD-10-CM | POA: Diagnosis not present

## 2017-05-02 DIAGNOSIS — Z124 Encounter for screening for malignant neoplasm of cervix: Secondary | ICD-10-CM | POA: Diagnosis not present

## 2017-05-02 DIAGNOSIS — Z8542 Personal history of malignant neoplasm of other parts of uterus: Secondary | ICD-10-CM | POA: Diagnosis not present

## 2017-05-02 DIAGNOSIS — Z01419 Encounter for gynecological examination (general) (routine) without abnormal findings: Secondary | ICD-10-CM | POA: Diagnosis not present

## 2017-05-02 DIAGNOSIS — Z808 Family history of malignant neoplasm of other organs or systems: Secondary | ICD-10-CM | POA: Diagnosis not present

## 2017-09-15 DIAGNOSIS — E559 Vitamin D deficiency, unspecified: Secondary | ICD-10-CM | POA: Diagnosis not present

## 2017-09-15 DIAGNOSIS — R7301 Impaired fasting glucose: Secondary | ICD-10-CM | POA: Diagnosis not present

## 2017-09-15 DIAGNOSIS — E7800 Pure hypercholesterolemia, unspecified: Secondary | ICD-10-CM | POA: Diagnosis not present

## 2017-09-20 DIAGNOSIS — E785 Hyperlipidemia, unspecified: Secondary | ICD-10-CM | POA: Diagnosis not present

## 2017-09-20 DIAGNOSIS — R7301 Impaired fasting glucose: Secondary | ICD-10-CM | POA: Diagnosis not present

## 2017-10-25 DIAGNOSIS — Z7183 Encounter for nonprocreative genetic counseling: Secondary | ICD-10-CM | POA: Diagnosis not present

## 2017-12-08 DIAGNOSIS — Z23 Encounter for immunization: Secondary | ICD-10-CM | POA: Diagnosis not present

## 2018-02-13 DIAGNOSIS — H04123 Dry eye syndrome of bilateral lacrimal glands: Secondary | ICD-10-CM | POA: Diagnosis not present

## 2018-02-13 DIAGNOSIS — H25813 Combined forms of age-related cataract, bilateral: Secondary | ICD-10-CM | POA: Diagnosis not present

## 2018-03-21 DIAGNOSIS — Z923 Personal history of irradiation: Secondary | ICD-10-CM

## 2018-03-21 DIAGNOSIS — C50919 Malignant neoplasm of unspecified site of unspecified female breast: Secondary | ICD-10-CM

## 2018-03-21 HISTORY — DX: Malignant neoplasm of unspecified site of unspecified female breast: C50.919

## 2018-03-21 HISTORY — DX: Personal history of irradiation: Z92.3

## 2018-03-28 DIAGNOSIS — Z0000 Encounter for general adult medical examination without abnormal findings: Secondary | ICD-10-CM | POA: Diagnosis not present

## 2018-03-28 DIAGNOSIS — E559 Vitamin D deficiency, unspecified: Secondary | ICD-10-CM | POA: Diagnosis not present

## 2018-03-28 DIAGNOSIS — K219 Gastro-esophageal reflux disease without esophagitis: Secondary | ICD-10-CM | POA: Diagnosis not present

## 2018-03-28 DIAGNOSIS — E039 Hypothyroidism, unspecified: Secondary | ICD-10-CM | POA: Diagnosis not present

## 2018-03-28 DIAGNOSIS — Z8543 Personal history of malignant neoplasm of ovary: Secondary | ICD-10-CM | POA: Diagnosis not present

## 2018-03-28 DIAGNOSIS — E785 Hyperlipidemia, unspecified: Secondary | ICD-10-CM | POA: Diagnosis not present

## 2018-04-21 ENCOUNTER — Emergency Department (HOSPITAL_COMMUNITY): Payer: Medicare Other

## 2018-04-21 ENCOUNTER — Inpatient Hospital Stay (HOSPITAL_COMMUNITY)
Admission: EM | Admit: 2018-04-21 | Discharge: 2018-04-22 | DRG: 065 | Disposition: A | Discharge status: Home / Self Care | Payer: Medicare Other | Attending: Family Medicine | Admitting: Family Medicine

## 2018-04-21 ENCOUNTER — Other Ambulatory Visit: Payer: Self-pay

## 2018-04-21 ENCOUNTER — Encounter (HOSPITAL_COMMUNITY): Payer: Self-pay | Admitting: Radiology

## 2018-04-21 DIAGNOSIS — Z79890 Hormone replacement therapy (postmenopausal): Secondary | ICD-10-CM | POA: Diagnosis not present

## 2018-04-21 DIAGNOSIS — Z6841 Body Mass Index (BMI) 40.0 and over, adult: Secondary | ICD-10-CM | POA: Diagnosis not present

## 2018-04-21 DIAGNOSIS — Z823 Family history of stroke: Secondary | ICD-10-CM

## 2018-04-21 DIAGNOSIS — E669 Obesity, unspecified: Secondary | ICD-10-CM | POA: Diagnosis present

## 2018-04-21 DIAGNOSIS — G459 Transient cerebral ischemic attack, unspecified: Secondary | ICD-10-CM | POA: Diagnosis not present

## 2018-04-21 DIAGNOSIS — Z7982 Long term (current) use of aspirin: Secondary | ICD-10-CM

## 2018-04-21 DIAGNOSIS — E785 Hyperlipidemia, unspecified: Secondary | ICD-10-CM | POA: Diagnosis present

## 2018-04-21 DIAGNOSIS — I639 Cerebral infarction, unspecified: Secondary | ICD-10-CM

## 2018-04-21 DIAGNOSIS — Z881 Allergy status to other antibiotic agents status: Secondary | ICD-10-CM

## 2018-04-21 DIAGNOSIS — Z79899 Other long term (current) drug therapy: Secondary | ICD-10-CM | POA: Diagnosis not present

## 2018-04-21 DIAGNOSIS — R200 Anesthesia of skin: Secondary | ICD-10-CM | POA: Diagnosis not present

## 2018-04-21 DIAGNOSIS — Z888 Allergy status to other drugs, medicaments and biological substances status: Secondary | ICD-10-CM | POA: Diagnosis not present

## 2018-04-21 DIAGNOSIS — I6523 Occlusion and stenosis of bilateral carotid arteries: Secondary | ICD-10-CM | POA: Diagnosis present

## 2018-04-21 DIAGNOSIS — E039 Hypothyroidism, unspecified: Secondary | ICD-10-CM

## 2018-04-21 DIAGNOSIS — E559 Vitamin D deficiency, unspecified: Secondary | ICD-10-CM | POA: Diagnosis present

## 2018-04-21 DIAGNOSIS — R29810 Facial weakness: Secondary | ICD-10-CM | POA: Diagnosis present

## 2018-04-21 DIAGNOSIS — Z8543 Personal history of malignant neoplasm of ovary: Secondary | ICD-10-CM | POA: Diagnosis not present

## 2018-04-21 DIAGNOSIS — Z8673 Personal history of transient ischemic attack (TIA), and cerebral infarction without residual deficits: Secondary | ICD-10-CM | POA: Diagnosis not present

## 2018-04-21 DIAGNOSIS — Z90710 Acquired absence of both cervix and uterus: Secondary | ICD-10-CM | POA: Diagnosis not present

## 2018-04-21 DIAGNOSIS — R29818 Other symptoms and signs involving the nervous system: Secondary | ICD-10-CM | POA: Diagnosis not present

## 2018-04-21 DIAGNOSIS — Z791 Long term (current) use of non-steroidal anti-inflammatories (NSAID): Secondary | ICD-10-CM

## 2018-04-21 DIAGNOSIS — R7303 Prediabetes: Secondary | ICD-10-CM | POA: Diagnosis present

## 2018-04-21 DIAGNOSIS — I6381 Other cerebral infarction due to occlusion or stenosis of small artery: Secondary | ICD-10-CM | POA: Diagnosis not present

## 2018-04-21 DIAGNOSIS — R29701 NIHSS score 1: Secondary | ICD-10-CM | POA: Diagnosis present

## 2018-04-21 DIAGNOSIS — K219 Gastro-esophageal reflux disease without esophagitis: Secondary | ICD-10-CM

## 2018-04-21 DIAGNOSIS — I6350 Cerebral infarction due to unspecified occlusion or stenosis of unspecified cerebral artery: Secondary | ICD-10-CM | POA: Diagnosis not present

## 2018-04-21 DIAGNOSIS — I10 Essential (primary) hypertension: Secondary | ICD-10-CM | POA: Diagnosis present

## 2018-04-21 HISTORY — DX: Hypothyroidism, unspecified: E03.9

## 2018-04-21 HISTORY — DX: Cerebral infarction, unspecified: I63.9

## 2018-04-21 LAB — URINALYSIS, ROUTINE W REFLEX MICROSCOPIC
Bilirubin Urine: NEGATIVE
Glucose, UA: NEGATIVE mg/dL
Hgb urine dipstick: NEGATIVE
Ketones, ur: NEGATIVE mg/dL
Leukocytes, UA: NEGATIVE
Nitrite: NEGATIVE
Protein, ur: NEGATIVE mg/dL
Specific Gravity, Urine: 1.017 (ref 1.005–1.030)
pH: 8 (ref 5.0–8.0)

## 2018-04-21 LAB — DIFFERENTIAL
Abs Immature Granulocytes: 0.02 10*3/uL (ref 0.00–0.07)
Basophils Absolute: 0 10*3/uL (ref 0.0–0.1)
Basophils Relative: 1 %
Eosinophils Absolute: 0.1 10*3/uL (ref 0.0–0.5)
Eosinophils Relative: 1 %
Immature Granulocytes: 0 %
Lymphocytes Relative: 28 %
Lymphs Abs: 1.6 10*3/uL (ref 0.7–4.0)
Monocytes Absolute: 0.5 10*3/uL (ref 0.1–1.0)
Monocytes Relative: 9 %
Neutro Abs: 3.5 10*3/uL (ref 1.7–7.7)
Neutrophils Relative %: 61 %

## 2018-04-21 LAB — COMPREHENSIVE METABOLIC PANEL
ALT: 24 U/L (ref 0–44)
AST: 22 U/L (ref 15–41)
Albumin: 3.3 g/dL — ABNORMAL LOW (ref 3.5–5.0)
Alkaline Phosphatase: 77 U/L (ref 38–126)
Anion gap: 10 (ref 5–15)
BUN: 14 mg/dL (ref 8–23)
CO2: 25 mmol/L (ref 22–32)
Calcium: 9 mg/dL (ref 8.9–10.3)
Chloride: 104 mmol/L (ref 98–111)
Creatinine, Ser: 0.92 mg/dL (ref 0.44–1.00)
GFR calc Af Amer: >60 mL/min (ref >60)
GFR calc non Af Amer: >60 mL/min (ref >60)
Glucose, Bld: 102 mg/dL — ABNORMAL HIGH (ref 70–99)
Potassium: 3.8 mmol/L (ref 3.5–5.1)
Sodium: 139 mmol/L (ref 135–145)
Total Bilirubin: 0.5 mg/dL (ref 0.3–1.2)
Total Protein: 6.9 g/dL (ref 6.5–8.1)

## 2018-04-21 LAB — RAPID URINE DRUG SCREEN, HOSP PERFORMED
Amphetamines: NOT DETECTED
Barbiturates: NOT DETECTED
Benzodiazepines: NOT DETECTED
Cocaine: NOT DETECTED
Opiates: NOT DETECTED
Tetrahydrocannabinol: NOT DETECTED

## 2018-04-21 LAB — I-STAT CREATININE, ED: Creatinine, Ser: 0.9 mg/dL (ref 0.44–1.00)

## 2018-04-21 LAB — CBC
HCT: 39.1 % (ref 36.0–46.0)
Hemoglobin: 12.5 g/dL (ref 12.0–15.0)
MCH: 28 pg (ref 26.0–34.0)
MCHC: 32 g/dL (ref 30.0–36.0)
MCV: 87.5 fL (ref 80.0–100.0)
Platelets: 194 10*3/uL (ref 150–400)
RBC: 4.47 MIL/uL (ref 3.87–5.11)
RDW: 12.3 % (ref 11.5–15.5)
WBC: 5.7 10*3/uL (ref 4.0–10.5)
nRBC: 0 % (ref 0.0–0.2)

## 2018-04-21 LAB — I-STAT TROPONIN, ED: Troponin i, poc: 0.01 ng/mL (ref 0.00–0.08)

## 2018-04-21 LAB — APTT: aPTT: 27 seconds (ref 24–36)

## 2018-04-21 LAB — T4, FREE: Free T4: 1.16 ng/dL (ref 0.82–1.77)

## 2018-04-21 LAB — ETHANOL: Alcohol, Ethyl (B): <10 mg/dL (ref <10)

## 2018-04-21 LAB — TSH: TSH: 0.168 u[IU]/mL — ABNORMAL LOW (ref 0.350–4.500)

## 2018-04-21 LAB — CBG MONITORING, ED: Glucose-Capillary: 94 mg/dL (ref 70–99)

## 2018-04-21 LAB — PROTIME-INR
INR: 1.01
Prothrombin Time: 13.2 seconds (ref 11.4–15.2)

## 2018-04-21 MED ORDER — LEVOTHYROXINE SODIUM 112 MCG PO TABS
112.0000 ug | ORAL_TABLET | Freq: Every day | ORAL | Status: DC
  Administered 2018-04-22: 112 ug via ORAL
  Filled 2018-04-21: qty 1

## 2018-04-21 MED ORDER — ASPIRIN 300 MG RE SUPP: 300.0000 mg | Freq: Every day | RECTAL | Status: DC

## 2018-04-21 MED ORDER — ACETAMINOPHEN 160 MG/5ML PO SOLN: 650.0000 mg | ORAL | Status: DC | PRN

## 2018-04-21 MED ORDER — ROSUVASTATIN CALCIUM 20 MG PO TABS
40.0000 mg | ORAL_TABLET | Freq: Every day | ORAL | Status: DC
  Administered 2018-04-21: 40 mg via ORAL
  Filled 2018-04-21: qty 2

## 2018-04-21 MED ORDER — ACETAMINOPHEN 650 MG RE SUPP: 650.0000 mg | RECTAL | Status: DC | PRN

## 2018-04-21 MED ORDER — PANTOPRAZOLE SODIUM 40 MG PO TBEC
40.0000 mg | DELAYED_RELEASE_TABLET | Freq: Every day | ORAL | Status: DC
  Administered 2018-04-22: 40 mg via ORAL
  Filled 2018-04-21: qty 1

## 2018-04-21 MED ORDER — STROKE: EARLY STAGES OF RECOVERY BOOK
Freq: Once | Status: AC
  Administered 2018-04-21: 1
  Filled 2018-04-21: qty 1

## 2018-04-21 MED ORDER — ATORVASTATIN CALCIUM 80 MG PO TABS
80.0000 mg | ORAL_TABLET | Freq: Every day | ORAL | Status: DC
  Filled 2018-04-21: qty 1

## 2018-04-21 MED ORDER — ACETAMINOPHEN 325 MG PO TABS: 650.0000 mg | ORAL_TABLET | ORAL | Status: DC | PRN

## 2018-04-21 MED ORDER — SODIUM CHLORIDE 0.9 % IV SOLN
INTRAVENOUS | Status: DC
  Administered 2018-04-21 – 2018-04-22 (×2): via INTRAVENOUS

## 2018-04-21 MED ORDER — ENOXAPARIN SODIUM 40 MG/0.4ML ~~LOC~~ SOLN
40.0000 mg | SUBCUTANEOUS | Status: DC
  Administered 2018-04-21: 40 mg via SUBCUTANEOUS
  Filled 2018-04-21: qty 0.4

## 2018-04-21 MED ORDER — IOPAMIDOL (ISOVUE-370) INJECTION 76%
INTRAVENOUS | Status: AC
  Administered 2018-04-21: 50 mL
  Filled 2018-04-21: qty 50

## 2018-04-21 MED ORDER — ASPIRIN 325 MG PO TABS
325.0000 mg | ORAL_TABLET | Freq: Every day | ORAL | Status: DC
  Administered 2018-04-21 – 2018-04-22 (×2): 325 mg via ORAL
  Filled 2018-04-21 (×2): qty 1

## 2018-04-21 MED ORDER — LORATADINE 10 MG PO TABS
10.0000 mg | ORAL_TABLET | Freq: Every day | ORAL | Status: DC
  Administered 2018-04-22: 10 mg via ORAL
  Filled 2018-04-21: qty 1

## 2018-04-21 MED ORDER — SENNOSIDES-DOCUSATE SODIUM 8.6-50 MG PO TABS: 1.0000 | ORAL_TABLET | Freq: Every evening | ORAL | Status: DC | PRN

## 2018-04-21 NOTE — Consult Note (Signed)
Neurology Consultation  Reason for Consult: Code stroke for left-sided numbness Referring Physician: Dr. Daleen Bo  CC: Left-sided numbness  History is obtained from:Chart, patient  HPI: Alexandra Powell is a 73 y.o. female past medical history of hypothyroidism, gastroesophageal reflux, who was in her usual state of health when she woke up this morning around 8 AM and soon after after he went to the bathroom started feeling what she describes as a "funny feeling".  Very soon after that within a few minutes she started feeling some numbness on the left side of her face and left arm.  Her left arm felt a little heavy.  She had no slurred speech.  She had no facial droop according to her.  EMS was called.  Her blood pressures were significantly elevated with systolics over 161W.  She was brought into the emergency room.  She was evaluated by the ED provider.  Minimal symptoms but last known well being within 4 hours, code stroke was activated. She confirmed the history as provided above.  She also says that most of her symptoms especially in the face have resolved but she has still some residual numbness in the left arm. No weakness.  No headache.  No double vision but says her vision might be blurry both ways but that might just be because she is not wearing her glasses. Denies chest pain shortness breath nausea vomiting.  Denies proceeding with sicknesses.   LKW: 8 AM on 04/21/2018 tpa given?: no, NIH 1 for sensory Premorbid modified Rankin scale (mRS): 0  ROS: ROS was performed and is negative except as noted in the HPI.    Past Medical History:  Diagnosis Date  . GERD (gastroesophageal reflux disease)   . Thyroid disease   . Vitamin D deficiency    No family history on file.  Social History:   reports that she has never smoked. She has never used smokeless tobacco. She reports that she does not drink alcohol or use drugs.  Medications No current facility-administered  medications for this encounter.   Current Outpatient Medications:  .  ibuprofen (ADVIL,MOTRIN) 600 MG tablet, Take 1 tablet (600 mg total) by mouth every 6 (six) hours as needed., Disp: 30 tablet, Rfl: 0   Exam: Current vital signs: BP (!) 163/70 (BP Location: Right Arm)   Pulse 71   Temp 98.3 F (36.8 C) (Oral)   Resp 20   Ht 5\' 2"  (1.575 m)   Wt 101.6 kg   SpO2 99%   BMI 40.97 kg/m  Vital signs in last 24 hours: Temp:  [98.3 F (36.8 C)] 98.3 F (36.8 C) (02/01 1003) Pulse Rate:  [71] 71 (02/01 1003) Resp:  [20] 20 (02/01 1003) BP: (163)/(70) 163/70 (02/01 1003) SpO2:  [97 %-99 %] 99 % (02/01 1003) Weight:  [101.6 kg] 101.6 kg (02/01 1005)  GENERAL: Awake, alert in NAD HEENT: - Normocephalic and atraumatic, dry mm, no LN++, no Thyromegally LUNGS - Clear to auscultation bilaterally with no wheezes CV - S1S2 RRR, no m/r/g, equal pulses bilaterally. ABDOMEN - Soft, nontender, nondistended with normoactive BS Ext: warm, well perfused, intact peripheral pulses, no edema. NEURO:  Mental Status: AA&Ox3  Language: speech is clear.  Naming, repetition, fluency, and comprehension intact. Cranial Nerves: PERRL. EOMI, visual fields full, no facial asymmetry, facial sensation intact, hearing intact, tongue/uvula/soft palate midline, normal sternocleidomastoid and trapezius muscle strength. No evidence of tongue atrophy or fibrillations Motor: 5/5 all over Tone: is normal and bulk is normal Sensation-  intact on face and legs b/l but mild decrease on left arm compared to right. Coordination: FTN intact bilaterally, no ataxia in BLE. Gait- deferred NIHSS  -  1  Labs I have reviewed labs in epic and the results pertinent to this consultation are: PENDING AT THE TIME OF DICTATION  Imaging I have reviewed the images obtained: CT-scan of the brain-no acute changes.  No bleed.  Aspects 10 CT angiogram head and neck-no emergent proximal large vessel occlusion.  Formal read pending.   Possible distal right M3 or M4 branch occlusions.  Assessment: 73 year old woman with past medical history as above presenting for evaluation of left face arm and leg numbness that lasted for a few hours.  She was also noted to have systolic blood pressures higher than 200s at the time.  Blood pressures of started to come down but she has persistent left arm numbness.  Face and leg numbness have subsided. This could be a pure sensory lacunar infarct versus TIA.  I would recommend admission for stroke/TIA risk factor work-up. Other differentials to consider would be hypertensive emergency and posterior reversible encephalopathy syndrome (PRES)  as she had some blurred vision as well.  Imaging would be helpful to look for evidence of PRES  Not a candidate for TPA due to NIH stroke scale 1 and minimal symptoms. Not a candidate for endovascular thrombectomy due to absence of any large vessel occlusion on vascular imaging.  Impression:  Evaluate for stroke versus TIA Evaluate for posterior reversible encephalopathy syndrome [PRES]  Recommendations: Admit to hospitalist service For now allow for permissive hypertension for 24 to 48 hours treating only if systolic blood pressures are greater than 220.  Can gradually start normalizing blood pressures after 24 to 48 hours.  On discharge, goal blood pressure 140/90 or below Aspirin 325 mg p.o. daily.  First dose now Atorvastatin 80 mg p.o. daily.  First dose now MRI of the brain without contrast 2D echocardiogram Telemetry Hemoglobin A1c Fasting lipid panel Physical therapy Occupational Therapy Speech therapy N.p.o. until cleared by bedside swallow evaluation or speech evaluation. Risk factor modification including management of obesity.  -- Amie Portland, MD [facility] Pager: [phone] If 7pm to 7am, please call on call as listed on AMION.

## 2018-04-21 NOTE — ED Provider Notes (Signed)
[facility] EMERGENCY DEPARTMENT Provider Note   CSN: [id number] Arrival date & time: 04/21/18  7353     History   Chief Complaint Chief Complaint  Patient presents with  . Left Sided Numbness    HPI Alexandra Powell is a 73 y.o. female.  HPI   She presents for evaluation of a unilateral sensation of numbness in face and left arm, associated with a heavy feeling, in the same sites.  This occurred at approximately 8:40 AM after she had been awake about 10 minutes and was sitting on the commode.  She called EMS who found that her blood pressure was elevated, and transferred her here.  She states that the symptoms have improved but are persistent.  No prior similar problems.  She took her morning medicines after onset of the symptoms.  She did not eat.  No prior similar problems.  She is normally normotensive.  There are no other known modifying factors.  Past Medical History:  Diagnosis Date  . GERD (gastroesophageal reflux disease)   . Hypothyroidism (acquired)   . Vitamin D deficiency     Patient Active Problem List   Diagnosis Date Noted  . 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 Hypothyroidism (acquired)   . GERD (gastroesophageal reflux disease)   . Lacunar infarct, acute [facility])     Past Surgical History:  Procedure Laterality Date  . HYSTERECTOMY ABDOMINAL WITH SALPINGO-OOPHORECTOMY     remote ovarian cancer  . TONSILLECTOMY       OB History   No obstetric history on file.      Home Medications    Prior to Admission medications   Medication Sig Start Date End Date Taking? Authorizing Provider  cetirizine (ZYRTEC) 5 MG tablet Take 5 mg by mouth daily.   Yes [provider]  co-enzyme Q-10 30 MG capsule Take 30 mg by mouth daily.   Yes [provider]  ibuprofen (ADVIL,MOTRIN) 600 MG tablet Take 1 tablet (600 mg total) by mouth every 6 (six) hours as needed. Patient taking differently: Take 600 mg by mouth 2 (two) times  daily.  05/25/16  Yes Nona Dell, PA-C  levothyroxine (SYNTHROID, LEVOTHROID) 112 MCG tablet Take 112 mcg by mouth daily before breakfast.   Yes [provider]  Magnesium 250 MG TABS Take 250 mg by mouth daily.   Yes [provider]  Multiple Vitamins-Minerals (MULTIVITAMIN WITH MINERALS) tablet Take 1 tablet by mouth daily.   Yes [provider]  Omega-3 Fatty Acids (FISH OIL) 500 MG CAPS Take by mouth.   Yes [provider]  omeprazole (PRILOSEC) 20 MG capsule Take 20 mg by mouth daily.   Yes [provider]  Vitamin D, Ergocalciferol, (DRISDOL) 1.25 MG (50000 UT) CAPS capsule Take 50,000 Units by mouth every 7 (seven) days. Pt takes on Friday of each week   Yes [provider]    Family History Family History  Problem Relation Age of Onset  . Stroke Mother   . Stroke Maternal Grandfather     Social History Social History   Tobacco Use  . Smoking status: Never Smoker  . Smokeless tobacco: Never Used  Substance Use Topics  . Alcohol use: No  . Drug use: No     Allergies   Erythromycin; Cephalosporins; and Lipitor [atorvastatin]   Review of Systems Review of Systems  All other systems reviewed and are negative.    Physical Exam Updated Vital Signs BP (!) 101/46 (BP Location: Right  Arm)   Pulse 95   Temp 98.4 F (36.9 C) (Oral)   Resp 17   Ht 5\' 2"  (1.575 m)   Wt 101.6 kg   SpO2 96%   BMI 40.97 kg/m   Physical Exam Vitals signs and nursing note reviewed.  Constitutional:      General: She is not in acute distress.    Appearance: Normal appearance. She is well-developed. She is obese. She is not ill-appearing, toxic-appearing or diaphoretic.  HENT:     Head: Normocephalic and atraumatic.     Right Ear: External ear normal.     Left Ear: External ear normal.  Eyes:     Conjunctiva/sclera: Conjunctivae normal.     Pupils: Pupils are equal, round, and reactive to light.  Neck:      Musculoskeletal: Normal range of motion and neck supple.     Trachea: Phonation normal.  Cardiovascular:     Rate and Rhythm: Normal rate and regular rhythm.     Heart sounds: Normal heart sounds.  Pulmonary:     Effort: Pulmonary effort is normal.     Breath sounds: Normal breath sounds.  Abdominal:     Palpations: Abdomen is soft.     Tenderness: There is no abdominal tenderness.  Musculoskeletal: Normal range of motion.  Skin:    General: Skin is warm and dry.  Neurological:     Mental Status: She is alert and oriented to person, place, and time.     Cranial Nerves: No cranial nerve deficit.     Sensory: Sensory deficit present.     Motor: No abnormal muscle tone.     Coordination: Coordination normal.     Comments: No dysarthria, aphasia or nystagmus.  No ataxia.  No pronator drift.  Very mild decreased light touch sensation of the left face, as compared to the right.  Psychiatric:        Behavior: Behavior normal.        Thought Content: Thought content normal.        Judgment: Judgment normal.      ED Treatments / Results  Labs (all labs ordered are listed, but only abnormal results are displayed) Labs Reviewed  COMPREHENSIVE METABOLIC PANEL - Abnormal; Notable for the following components:      Result Value   Glucose, Bld 102 (*)    Albumin 3.3 (*)    All other components within normal limits  URINALYSIS, ROUTINE W REFLEX MICROSCOPIC - Abnormal; Notable for the following components:   Color, Urine STRAW (*)    All other components within normal limits  ETHANOL  PROTIME-INR  APTT  CBC  DIFFERENTIAL  RAPID URINE DRUG SCREEN, HOSP PERFORMED  HEMOGLOBIN A1C  LIPID PANEL  TSH  T4, FREE  I-STAT TROPONIN, ED  I-STAT CREATININE, ED  CBG MONITORING, ED    EKG EKG Interpretation  Date/Time:  Saturday April 21 2018 10:10:59 EST Ventricular Rate:  68 PR Interval:    QRS Duration: 88 QT Interval:  440 QTC Calculation: 468 R Axis:   -25 Text  Interpretation:  Sinus rhythm Low voltage, precordial leads LVH by voltage since last tracing no significant change Confirmed by Daleen Bo [phone]) on 04/21/2018 10:36:16 AM    Radiology Ct Angio Head W Or Wo Contrast  Result Date: 04/21/2018 CLINICAL DATA:  LEFT-sided facial numbness, slowly resolving. EXAM: CT ANGIOGRAPHY HEAD AND NECK TECHNIQUE: Multidetector CT imaging of the head and neck was performed using the standard protocol during bolus administration of intravenous contrast.  Multiplanar CT image reconstructions and MIPs were obtained to evaluate the vascular anatomy. Carotid stenosis measurements (when applicable) are obtained utilizing NASCET criteria, using the distal internal carotid diameter as the denominator. CONTRAST:  59mL ISOVUE-370 IOPAMIDOL (ISOVUE-370) INJECTION 76% COMPARISON:  CT head earlier today. FINDINGS: CTA NECK FINDINGS Aortic arch: Standard branching. Imaged portion shows no evidence of aneurysm or dissection. No significant stenosis of the major arch vessel origins. Aortic atherosclerosis. Right carotid system: No evidence of dissection, stenosis (50% or greater) or occlusion. Left carotid system: No evidence of dissection, stenosis (50% or greater) or occlusion. Vertebral arteries: LEFT vertebral dominant. Both patent. No flow-limiting ostial stenosis or narrowing in the. Skeleton: Spondylosis. No worrisome osseous lesion. Other neck: No masses. Upper chest: Clear. Review of the MIP images confirms the above findings CTA HEAD FINDINGS Anterior circulation: Calcification of the cavernous internal carotid arteries consistent with cerebrovascular atherosclerotic disease. No signs of intracranial large vessel occlusion or flow-limiting stenosis. Posterior circulation: Calcification of the distal vertebral arteries consistent with cerebrovascular atherosclerotic disease. LEFT vertebral dominant. No signs of intracranial large vessel occlusion or flow-limiting stenosis. Venous  sinuses: As permitted by contrast timing, patent. Anatomic variants: None of significance Delayed phase: Not performed. Review of the MIP images confirms the above findings IMPRESSION: No extracranial or intracranial flow-limiting stenosis or dissection. Aortic Atherosclerosis (ICD10-I70.0). These results were called by telephone at the time of interpretation on 04/21/2018 at 10:59 am to Dr. Amie Portland , who verbally acknowledged these results. Electronically Signed   By: Staci Righter M.D.   On: 04/21/2018 11:15   Ct Angio Neck W Or Wo Contrast  Result Date: 04/21/2018 CLINICAL DATA:  LEFT-sided facial numbness, slowly resolving. EXAM: CT ANGIOGRAPHY HEAD AND NECK TECHNIQUE: Multidetector CT imaging of the head and neck was performed using the standard protocol during bolus administration of intravenous contrast. Multiplanar CT image reconstructions and MIPs were obtained to evaluate the vascular anatomy. Carotid stenosis measurements (when applicable) are obtained utilizing NASCET criteria, using the distal internal carotid diameter as the denominator. CONTRAST:  31mL ISOVUE-370 IOPAMIDOL (ISOVUE-370) INJECTION 76% COMPARISON:  CT head earlier today. FINDINGS: CTA NECK FINDINGS Aortic arch: Standard branching. Imaged portion shows no evidence of aneurysm or dissection. No significant stenosis of the major arch vessel origins. Aortic atherosclerosis. Right carotid system: No evidence of dissection, stenosis (50% or greater) or occlusion. Left carotid system: No evidence of dissection, stenosis (50% or greater) or occlusion. Vertebral arteries: LEFT vertebral dominant. Both patent. No flow-limiting ostial stenosis or narrowing in the. Skeleton: Spondylosis. No worrisome osseous lesion. Other neck: No masses. Upper chest: Clear. Review of the MIP images confirms the above findings CTA HEAD FINDINGS Anterior circulation: Calcification of the cavernous internal carotid arteries consistent with cerebrovascular  atherosclerotic disease. No signs of intracranial large vessel occlusion or flow-limiting stenosis. Posterior circulation: Calcification of the distal vertebral arteries consistent with cerebrovascular atherosclerotic disease. LEFT vertebral dominant. No signs of intracranial large vessel occlusion or flow-limiting stenosis. Venous sinuses: As permitted by contrast timing, patent. Anatomic variants: None of significance Delayed phase: Not performed. Review of the MIP images confirms the above findings IMPRESSION: No extracranial or intracranial flow-limiting stenosis or dissection. Aortic Atherosclerosis (ICD10-I70.0). These results were called by telephone at the time of interpretation on 04/21/2018 at 10:59 am to Dr. Amie Portland , who verbally acknowledged these results. Electronically Signed   By: Staci Righter M.D.   On: 04/21/2018 11:15   Mr Brain Wo Contrast  Result Date: 04/21/2018 CLINICAL DATA:  73 year old  female code stroke presentation with left side facial numbness. EXAM: MRI HEAD WITHOUT CONTRAST TECHNIQUE: Multiplanar, multiecho pulse sequences of the brain and surrounding structures were obtained without intravenous contrast. COMPARISON:  CT head, CTA head and neck earlier today. FINDINGS: Brain: Linear restricted diffusion in the central right thalamus on series 4, image 24. Faint associated T2 and FLAIR hyperintensity. No hemorrhage or mass effect. No other restricted diffusion. No midline shift, mass effect, evidence of mass lesion, ventriculomegaly, extra-axial collection or acute intracranial hemorrhage. Cervicomedullary junction and pituitary are within normal limits. Patchy and occasionally confluent bilateral cerebral white matter T2 and FLAIR hyperintensity. Two chronic microhemorrhages are identified, in the left midbrain (series 11, image 55) and right temporal lobe (image 59). No cortical encephalomalacia. The basal ganglia, left thalamus, pons and cerebellum are within normal limits.  Vascular: Major intracranial vascular flow voids are preserved. Skull and upper cervical spine: Negative visible cervical spine. Visualized bone marrow signal is within normal limits. Sinuses/Orbits: Negative. Other: Visible internal auditory structures appear normal. Scalp and face soft tissues are within normal limits. Accidentally, a partial intracranial MRA was performed and demonstrates antegrade flow in the visible anterior and posterior circulation with no gross abnormality aside from mild intracranial (mostly ICA siphon) tortuosity. Also accidentally, coronal T2 images were not obtained. IMPRESSION: 1. Small acute lacunar infarct in the central right thalamus. No acute hemorrhage or mass effect. 2. Underlying mild to moderate for age chronic small vessel disease suspected, including occasional chronic micro-hemorrhages. Electronically Signed   By: Genevie Ann M.D.   On: 04/21/2018 14:13   Ct Head Code Stroke Wo Contrast  Result Date: 04/21/2018 CLINICAL DATA:  Code stroke.  LEFT sided facial numbness. EXAM: CT HEAD WITHOUT CONTRAST TECHNIQUE: Contiguous axial images were obtained from the base of the skull through the vertex without intravenous contrast. COMPARISON:  None. FINDINGS: Brain: No evidence of acute infarction, hemorrhage, hydrocephalus, extra-axial collection or mass lesion/mass effect. Normal for age cerebral volume. Slight hypoattenuation of white matter, likely small vessel disease. Chronic LEFT cerebellar hemispheric infarct, with encephalomalacia. Vascular: Calcification of the cavernous internal carotid arteries consistent with cerebrovascular atherosclerotic disease. No signs of intracranial large vessel occlusion. Skull: Calvarium intact. Sinuses/Orbits: Unremarkable. Other: No middle ear or mastoid fluid. ASPECTS [facility] Stroke Program Early CT Score) - Ganglionic level infarction (caudate, lentiform nuclei, internal capsule, insula, M1-M3 cortex): 7 - Supraganglionic infarction (M4-M6  cortex): 3 Total score (0-10 with 10 being normal): 10 IMPRESSION: 1. Normal for age cerebral volume. No acute intracranial findings. Chronic LEFT cerebellar hemispheric infarct. 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 2. ASPECTS is 10. These results were communicated to Dr. Rory Percy at 10:29 amon 2/1/2020by text page via the [facility] messaging system. Electronically Signed   By: Staci Righter M.D.   On: 04/21/2018 10:34    Procedures .Critical Care Performed by: Daleen Bo, MD Authorized by: Daleen Bo, MD   Critical care provider statement:    Critical care time (minutes):  35   Critical care start time:  04/21/2018 10:03 AM   Critical care end time:  04/21/2018 12:13 PM   Critical care time was exclusive of:  Separately billable procedures and treating other patients   Critical care was necessary to treat or prevent imminent or life-threatening deterioration of the following conditions:  CNS failure or compromise   Critical care was time spent personally by me on the following activities:  Blood draw for specimens, development of treatment plan with patient or surrogate, discussions with consultants,  evaluation of patient's response to treatment, examination of patient, obtaining history from patient or surrogate, ordering and performing treatments and interventions, ordering and review of laboratory studies, pulse oximetry, re-evaluation of patient's condition, review of old charts and ordering and review of radiographic studies   (including critical care time)  Medications Ordered in ED Medications  aspirin suppository 300 mg ( Rectal See Alternative 04/21/18 1504)    Or  aspirin tablet 325 mg (325 mg Oral Given 04/21/18 1504)  levothyroxine (SYNTHROID, LEVOTHROID) tablet 112 mcg (has no administration in time range)  pantoprazole (PROTONIX) EC tablet 40 mg (has no administration in time range)  loratadine (CLARITIN) tablet 10 mg (has no administration in time  range)  0.9 %  sodium chloride infusion ( Intravenous New Bag/Given 04/21/18 1550)  acetaminophen (TYLENOL) tablet 650 mg (has no administration in time range)    Or  acetaminophen (TYLENOL) solution 650 mg (has no administration in time range)    Or  acetaminophen (TYLENOL) suppository 650 mg (has no administration in time range)  senna-docusate (Senokot-S) tablet 1 tablet (has no administration in time range)  rosuvastatin (CRESTOR) tablet 40 mg (40 mg Oral Given 04/21/18 1822)  iopamidol (ISOVUE-370) 76 % injection (50 mLs  Contrast Given 04/21/18 1045)   stroke: mapping our early stages of recovery book (1 each Does not apply Given 04/21/18 1110)     Initial Impression / Assessment and Plan / ED Course  I have reviewed the triage vital signs and the nursing notes.  Pertinent labs & imaging results that were available during my care of the patient were reviewed by me and considered in my medical decision making (see chart for details).  Clinical Course as of Apr 21 1917  Sat Apr 21, 2018  4235 Code stroke initiated   [EW]  1107 Case discussed with neuro hospitalist, he will remain on his consultant and request that the patient be admitted to the hospitalist service.   [EW]  1204 Normal  I-Stat Creatinine, ED (do not order at [facility]) [EW]  1204 Normal  Ethanol [EW]  1205 Normal  I-stat troponin, ED [EW]  1205 Normal  Comprehensive metabolic panel(!) [EW]  3614 Normal, images reviewed by me  CT HEAD CODE STROKE WO CONTRAST [EW]  4315 No clear vascular deformity, images reviewed by me  CT ANGIO HEAD W OR WO CONTRAST [EW]  1207 No clear vascular deformity, images reviewed by me  CT ANGIO NECK W OR WO CONTRAST [EW]    Clinical Course User Index [EW] Daleen Bo, MD     Patient Vitals for the past 24 hrs:  BP Temp Temp src Pulse Resp SpO2 Height Weight  04/21/18 1859 (!) 101/46 98.4 F (36.9 C) Oral 95 17 96 % - -  04/21/18 1855 - - Oral (!) 102 17 97 % - -  04/21/18 1711 (!)  124/52 98.4 F (36.9 C) Oral 82 (!) 21 100 % - -  04/21/18 1523 (!) 118/55 98 F (36.7 C) Oral (!) 59 - 100 % - -  04/21/18 1500 137/68 - - 82 17 99 % - -  04/21/18 1445 (!) 141/64 - - 78 13 98 % - -  04/21/18 1430 121/72 - - - - - - -  04/21/18 1200 (!) 141/62 - - 61 16 99 % - -  04/21/18 1145 (!) 143/82 - - 64 16 99 % - -  04/21/18 1130 (!) 162/79 - - 66 12 100 % - -  04/21/18 1115 (!) 156/62 - -  65 - 100 % - -  04/21/18 1100 (!) 160/60 - - 67 - 100 % - -  04/21/18 1045 (!) 156/70 - - 66 10 99 % - -  04/21/18 1030 (!) 164/72 - - 79 13 100 % - -  04/21/18 1015 (!) 160/70 - - 78 20 100 % - -  04/21/18 1005 - - - - - - 5\' 2"  (1.575 m) 101.6 kg  04/21/18 1003 (!) 163/70 98.3 F (36.8 C) Oral 71 20 99 % - -  04/21/18 1000 - - - - - 97 % - -    12:08 PM Reevaluation with update and discussion. After initial assessment and treatment, an updated evaluation reveals no progression of symptoms.  Case has been discussed with neuro hospitalist.  Plan admission by the hospitalist service, with MRI imaging and usual stroke management. Daleen Bo   Medical Decision Making: CVA, acute, with lacunar infarct on MRI imaging.  Mild blood pressure elevation likely precipitating CVA.  Patient requires admission for further management, evaluation and monitoring.  CRITICAL CARE-yes Performed by: Daleen Bo  Nursing Notes Reviewed/ Care Coordinated Applicable Imaging Reviewed Interpretation of Laboratory Data incorporated into ED treatment   Plan: Admit  Final Clinical Impressions(s) / ED Diagnoses   Final diagnoses:  Cerebrovascular accident (CVA), unspecified mechanism [facility])    ED Discharge Orders    None       Daleen Bo, MD 04/21/18 1919

## 2018-04-21 NOTE — ED Triage Notes (Signed)
Pt BIB [facility]. Per EMS pt woke up this morning around 0800 and felt fine. Pt then went to the bathroom and started to feel "heavy" with left sided facial and arm numbness. EMS reports patient was initially hypertensive at 210/122 upon their arrival. Pt denies any weakness in her arms and legs at this time. Pt reports that most of the numbness has resolved upon pt arrival to the ED.

## 2018-04-21 NOTE — ED Notes (Addendum)
Patient transported to MRI 

## 2018-04-21 NOTE — H&P (Addendum)
History and Physical    Alexandra Powell:[id number] DOB: Sep 05, 1945 DOA: 04/21/2018  PCP:  Concha Pyo Consultants:  [facility] - GI; OB/GYN Patient coming from:  Home - lives with husband; NOK: Husband, [phone]  Chief Complaint: numbness/weakness  HPI: Alexandra Powell is a 73 y.o. female with medical history significant of hypothyroidism presenting with left-sided numbness.  She felt fine this AM upon awakening.  A few minutes later she felt really funny.  She felt sort of distant, but was not dizzy.  She felt heavy.  She laid down and noticed left arm and face weakness.  Her BP was 160/ and she does not have known HTN.  Symptoms continued and so they called 911.  EMS BP was 210/.  No dysphagia or dysarthria.  She still feels slight left arm numbness.  Facial symptoms have resolved.  No facial droop.   ED Course:   Possible minor stroke - presented with left face and arm heaviness and paresthesias.  Code stroke.  Seen by neuro.  No tPA.  BP is higher than usual.  Getting MRI now.  Review of Systems: As per HPI; otherwise review of systems reviewed and negative.   Ambulatory Status:  Ambulates without assistance  Past Medical History:  Diagnosis Date  . GERD (gastroesophageal reflux disease)   . Hypothyroidism (acquired)   . Vitamin D deficiency     Past Surgical History:  Procedure Laterality Date  . HYSTERECTOMY ABDOMINAL WITH SALPINGO-OOPHORECTOMY     remote ovarian cancer  . TONSILLECTOMY      Social History   Socioeconomic History  . Marital status: Married    Spouse name: Not on file  . Number of children: Not on file  . Years of education: Not on file  . Highest education level: Not on file  Occupational History  . Occupation: retired  Scientific laboratory technician  . Financial resource strain: Not on file  . Food insecurity:    Worry: Not on file    Inability: Not on file  . Transportation needs:    Medical: Not on file    Non-medical: Not on file  Tobacco Use  . Smoking  status: Never Smoker  . Smokeless tobacco: Never Used  Substance and Sexual Activity  . Alcohol use: No  . Drug use: No  . Sexual activity: Not on file  Lifestyle  . Physical activity:    Days per week: Not on file    Minutes per session: Not on file  . Stress: Not on file  Relationships  . Social connections:    Talks on phone: Not on file    Gets together: Not on file    Attends religious service: Not on file    Active member of club or organization: Not on file    Attends meetings of clubs or organizations: Not on file    Relationship status: Not on file  . Intimate partner violence:    Fear of current or ex partner: Not on file    Emotionally abused: Not on file    Physically abused: Not on file    Forced sexual activity: Not on file  Other Topics Concern  . Not on file  Social History Narrative  . Not on file    Allergies  Allergen Reactions  . Erythromycin     "stomach on fire"  . Cephalosporins Rash    Family History  Problem Relation Age of Onset  . Stroke Mother   . Stroke Maternal Grandfather  Prior to Admission medications   Medication Sig Start Date End Date Taking? Authorizing Provider  cetirizine (ZYRTEC) 5 MG tablet Take 5 mg by mouth daily.   Yes [provider]  co-enzyme Q-10 30 MG capsule Take 30 mg by mouth daily.   Yes [provider]  ibuprofen (ADVIL,MOTRIN) 600 MG tablet Take 1 tablet (600 mg total) by mouth every 6 (six) hours as needed. Patient taking differently: Take 600 mg by mouth 2 (two) times daily.  05/25/16  Yes Nona Dell, PA-C  levothyroxine (SYNTHROID, LEVOTHROID) 112 MCG tablet Take 112 mcg by mouth daily before breakfast.   Yes [provider]  Magnesium 250 MG TABS Take 250 mg by mouth daily.   Yes [provider]  Multiple Vitamins-Minerals (MULTIVITAMIN WITH MINERALS) tablet Take 1 tablet by mouth daily.   Yes [provider]  Omega-3 Fatty Acids (FISH OIL) 500 MG  CAPS Take by mouth.   Yes [provider]  omeprazole (PRILOSEC) 20 MG capsule Take 20 mg by mouth daily.   Yes [provider]  Vitamin D, Ergocalciferol, (DRISDOL) 1.25 MG (50000 UT) CAPS capsule Take 50,000 Units by mouth every 7 (seven) days. Pt takes on Friday of each week   Yes [provider]    Physical Exam: Vitals:   04/21/18 1445 04/21/18 1500 04/21/18 1523 04/21/18 1711  BP: (!) 141/64 137/68 (!) 118/55 (!) 124/52  Pulse: 78 82 (!) 59 82  Resp: 13 17  (!) 21  Temp:   98 F (36.7 C) 98.4 F (36.9 C)  TempSrc:   Oral Oral  SpO2: 98% 99% 100% 100%  Weight:      Height:         . General:  Appears calm and comfortable and is NAD . Eyes:  PERRL, EOMI, normal lids, iris . ENT:  grossly normal hearing, lips & tongue, mmm; appropriate dentition . Neck:  no LAD, masses or thyromegaly; no carotid bruits . Cardiovascular:  RRR, no m/r/g. No LE edema.  Marland Kitchen Respiratory:   CTA bilaterally with no wheezes/rales/rhonchi.  Normal respiratory effort. . Abdomen:  soft, NT, ND, NABS . Back:   normal alignment, no CVAT . Skin:  no rash or induration seen on limited exam . Musculoskeletal:  grossly normal tone BUE/BLE, good ROM, no bony abnormality . Psychiatric:  grossly normal mood and affect, speech fluent and appropriate, AOx3 . Neurologic:  CN 2-12 grossly intact, moves all extremities in coordinated fashion, sensation diminished on all aspects of left face, arm, and leg    Radiological Exams on Admission: Ct Angio Head W Or Powell Contrast  Result Date: 04/21/2018 CLINICAL DATA:  LEFT-sided facial numbness, slowly resolving. EXAM: CT ANGIOGRAPHY HEAD AND NECK TECHNIQUE: Multidetector CT imaging of the head and neck was performed using the standard protocol during bolus administration of intravenous contrast. Multiplanar CT image reconstructions and MIPs were obtained to evaluate the vascular anatomy. Carotid stenosis measurements (when applicable) are obtained  utilizing NASCET criteria, using the distal internal carotid diameter as the denominator. CONTRAST:  35mL ISOVUE-370 IOPAMIDOL (ISOVUE-370) INJECTION 76% COMPARISON:  CT head earlier today. FINDINGS: CTA NECK FINDINGS Aortic arch: Standard branching. Imaged portion shows no evidence of aneurysm or dissection. No significant stenosis of the major arch vessel origins. Aortic atherosclerosis. Right carotid system: No evidence of dissection, stenosis (50% or greater) or occlusion. Left carotid system: No evidence of dissection, stenosis (50% or greater) or occlusion. Vertebral arteries: LEFT vertebral dominant. Both patent. No flow-limiting ostial  stenosis or narrowing in the. Skeleton: Spondylosis. No worrisome osseous lesion. Other neck: No masses. Upper chest: Clear. Review of the MIP images confirms the above findings CTA HEAD FINDINGS Anterior circulation: Calcification of the cavernous internal carotid arteries consistent with cerebrovascular atherosclerotic disease. No signs of intracranial large vessel occlusion or flow-limiting stenosis. Posterior circulation: Calcification of the distal vertebral arteries consistent with cerebrovascular atherosclerotic disease. LEFT vertebral dominant. No signs of intracranial large vessel occlusion or flow-limiting stenosis. Venous sinuses: As permitted by contrast timing, patent. Anatomic variants: None of significance Delayed phase: Not performed. Review of the MIP images confirms the above findings IMPRESSION: No extracranial or intracranial flow-limiting stenosis or dissection. Aortic Atherosclerosis (ICD10-I70.0). These results were called by telephone at the time of interpretation on 04/21/2018 at 10:59 am to Dr. Amie Portland , who verbally acknowledged these results. Electronically Signed   By: Staci Righter M.D.   On: 04/21/2018 11:15   Ct Angio Neck W Or Powell Contrast  Result Date: 04/21/2018 CLINICAL DATA:  LEFT-sided facial numbness, slowly resolving. EXAM: CT  ANGIOGRAPHY HEAD AND NECK TECHNIQUE: Multidetector CT imaging of the head and neck was performed using the standard protocol during bolus administration of intravenous contrast. Multiplanar CT image reconstructions and MIPs were obtained to evaluate the vascular anatomy. Carotid stenosis measurements (when applicable) are obtained utilizing NASCET criteria, using the distal internal carotid diameter as the denominator. CONTRAST:  6mL ISOVUE-370 IOPAMIDOL (ISOVUE-370) INJECTION 76% COMPARISON:  CT head earlier today. FINDINGS: CTA NECK FINDINGS Aortic arch: Standard branching. Imaged portion shows no evidence of aneurysm or dissection. No significant stenosis of the major arch vessel origins. Aortic atherosclerosis. Right carotid system: No evidence of dissection, stenosis (50% or greater) or occlusion. Left carotid system: No evidence of dissection, stenosis (50% or greater) or occlusion. Vertebral arteries: LEFT vertebral dominant. Both patent. No flow-limiting ostial stenosis or narrowing in the. Skeleton: Spondylosis. No worrisome osseous lesion. Other neck: No masses. Upper chest: Clear. Review of the MIP images confirms the above findings CTA HEAD FINDINGS Anterior circulation: Calcification of the cavernous internal carotid arteries consistent with cerebrovascular atherosclerotic disease. No signs of intracranial large vessel occlusion or flow-limiting stenosis. Posterior circulation: Calcification of the distal vertebral arteries consistent with cerebrovascular atherosclerotic disease. LEFT vertebral dominant. No signs of intracranial large vessel occlusion or flow-limiting stenosis. Venous sinuses: As permitted by contrast timing, patent. Anatomic variants: None of significance Delayed phase: Not performed. Review of the MIP images confirms the above findings IMPRESSION: No extracranial or intracranial flow-limiting stenosis or dissection. Aortic Atherosclerosis (ICD10-I70.0). These results were called by  telephone at the time of interpretation on 04/21/2018 at 10:59 am to Dr. Amie Portland , who verbally acknowledged these results. Electronically Signed   By: Staci Righter M.D.   On: 04/21/2018 11:15   Alexandra Powell Contrast  Result Date: 04/21/2018 CLINICAL DATA:  73 year old female code stroke presentation with left side facial numbness. EXAM: MRI HEAD WITHOUT CONTRAST TECHNIQUE: Multiplanar, multiecho pulse sequences of the brain and surrounding structures were obtained without intravenous contrast. COMPARISON:  CT head, CTA head and neck earlier today. FINDINGS: Brain: Linear restricted diffusion in the central right thalamus on series 4, image 24. Faint associated T2 and FLAIR hyperintensity. No hemorrhage or mass effect. No other restricted diffusion. No midline shift, mass effect, evidence of mass lesion, ventriculomegaly, extra-axial collection or acute intracranial hemorrhage. Cervicomedullary junction and pituitary are within normal limits. Patchy and occasionally confluent bilateral cerebral white matter T2 and FLAIR hyperintensity. Two chronic microhemorrhages are  identified, in the left midbrain (series 11, image 55) and right temporal lobe (image 59). No cortical encephalomalacia. The basal ganglia, left thalamus, pons and cerebellum are within normal limits. Vascular: Major intracranial vascular flow voids are preserved. Skull and upper cervical spine: Negative visible cervical spine. Visualized bone marrow signal is within normal limits. Sinuses/Orbits: Negative. Other: Visible internal auditory structures appear normal. Scalp and face soft tissues are within normal limits. Accidentally, a partial intracranial MRA was performed and demonstrates antegrade flow in the visible anterior and posterior circulation with no gross abnormality aside from mild intracranial (mostly ICA siphon) tortuosity. Also accidentally, coronal T2 images were not obtained. IMPRESSION: 1. Small acute lacunar infarct in the  central right thalamus. No acute hemorrhage or mass effect. 2. Underlying mild to moderate for age chronic small vessel disease suspected, including occasional chronic micro-hemorrhages. Electronically Signed   By: Genevie Ann M.D.   On: 04/21/2018 14:13   Ct Head Code Stroke Powell Contrast  Result Date: 04/21/2018 CLINICAL DATA:  Code stroke.  LEFT sided facial numbness. EXAM: CT HEAD WITHOUT CONTRAST TECHNIQUE: Contiguous axial images were obtained from the base of the skull through the vertex without intravenous contrast. COMPARISON:  None. FINDINGS: Brain: No evidence of acute infarction, hemorrhage, hydrocephalus, extra-axial collection or mass lesion/mass effect. Normal for age cerebral volume. Slight hypoattenuation of white matter, likely small vessel disease. Chronic LEFT cerebellar hemispheric infarct, with encephalomalacia. Vascular: Calcification of the cavernous internal carotid arteries consistent with cerebrovascular atherosclerotic disease. No signs of intracranial large vessel occlusion. Skull: Calvarium intact. Sinuses/Orbits: Unremarkable. Other: No middle ear or mastoid fluid. ASPECTS [facility] Stroke Program Early CT Score) - Ganglionic level infarction (caudate, lentiform nuclei, internal capsule, insula, M1-M3 cortex): 7 - Supraganglionic infarction (M4-M6 cortex): 3 Total score (0-10 with 10 being normal): 10 IMPRESSION: 1. Normal for age cerebral volume. No acute intracranial findings. Chronic LEFT cerebellar hemispheric infarct. 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 2. ASPECTS is 10. These results were communicated to Dr. Rory Percy at 10:29 amon 2/1/2020by text page via the [facility] messaging system. Electronically Signed   By: Staci Righter M.D.   On: 04/21/2018 10:34    EKG: Independently reviewed.  NSR with rate 68; LVH; no evidence of acute ischemia   Labs on Admission: I have personally reviewed the available labs and imaging studies at the time of the  admission.  Pertinent labs:   Unremarkable CMP Troponin 0.01 Normal CBC INR 1.01 ETOH <10  Assessment/Plan Principal Problem:   Stroke (cerebrum) (HCC) Active Problems:   Hypothyroidism (acquired)   GERD (gastroesophageal reflux disease)   CVA -Patient presenting with elevated BP and left face, arm, leg weakness/numbness -Concerning for TIA/CVA -CT and CTA negative for ischemia -However, MRI confirms small acute lacunar infarct in the central right thalamus; also with chronic microhemorrhages -Telemetry monitoring -Echo -Risk stratification with FLP, A1c; will also check TSH and UDS -ASA daily -Neurology consult -PT/OT/ST/Nutrition Consults -Allow permissive HTN for now; treat BP only if >220/120, and then with goal of 15% reduction.  She does not have known h/o HTN. -Empirically start Crestor 40 mg daily (had myalgias with Lipitor)  GERD -Continue PPI, substitute Protonix 40 mg for Prilosec 20 for now  Hypothyroidism -Check TSH and free T4 -Continue Synthroid at current dose for now      DVT prophylaxis:  SCDs Code Status: Full - confirmed with patient/family Family Communication: Husband and daughter present throughout evaluation Disposition Plan:  Home once clinically improved Consults called: Neurology; PT/OT/ST/Nutrition  Admission status: Admit - It is my clinical opinion that admission to INPATIENT is reasonable and necessary because of the expectation that this patient will require hospital care that crosses at least 2 midnights to treat this condition based on the medical complexity of the problems presented.  Given the aforementioned information, the predictability of an adverse outcome is felt to be significant.     Karmen Bongo MD [facility]   How to contact the [facility] Attending or Consulting provider [place] or covering provider during after hours [place], for this patient?  1. Check the care team in [facility] and look for a) attending/consulting  [facility] provider listed and b) the [facility] team listed 2. Log into [email or web address] and use [place]'s universal password to access. If you do not have the password, please contact the hospital operator. 3. Locate the [facility] provider you are looking for under [facility] and page to a number that you can be directly reached. 4. If you still have difficulty reaching the provider, please page the [facility] (Director on Call) for the Hospitalists listed on amion for assistance.   04/21/2018, 5:17 PM

## 2018-04-22 ENCOUNTER — Inpatient Hospital Stay (HOSPITAL_COMMUNITY): Payer: Medicare Other

## 2018-04-22 DIAGNOSIS — I639 Cerebral infarction, unspecified: Secondary | ICD-10-CM

## 2018-04-22 DIAGNOSIS — I6350 Cerebral infarction due to unspecified occlusion or stenosis of unspecified cerebral artery: Secondary | ICD-10-CM

## 2018-04-22 LAB — HEMOGLOBIN A1C
Hgb A1c MFr Bld: 6 % — ABNORMAL HIGH (ref 4.8–5.6)
Mean Plasma Glucose: 125.5 mg/dL

## 2018-04-22 LAB — LIPID PANEL
Cholesterol: 244 mg/dL — ABNORMAL HIGH (ref 0–200)
HDL: 43 mg/dL (ref >40)
LDL Cholesterol: 155 mg/dL — ABNORMAL HIGH (ref 0–99)
Total CHOL/HDL Ratio: 5.7 RATIO
Triglycerides: 228 mg/dL — ABNORMAL HIGH (ref <150)
VLDL: 46 mg/dL — ABNORMAL HIGH (ref 0–40)

## 2018-04-22 LAB — ECHOCARDIOGRAM COMPLETE
Height: 62 in
Weight: 3584 oz

## 2018-04-22 MED ORDER — ROSUVASTATIN CALCIUM 40 MG PO TABS: 40.0000 mg | ORAL_TABLET | Freq: Every day | ORAL | 0 refills | Status: DC

## 2018-04-22 MED ORDER — ASPIRIN EC 81 MG PO TBEC: 81.0000 mg | DELAYED_RELEASE_TABLET | Freq: Every day | ORAL | 0 refills | Status: AC

## 2018-04-22 MED ORDER — CLOPIDOGREL BISULFATE 75 MG PO TABS: 75.0000 mg | ORAL_TABLET | Freq: Every day | ORAL | 0 refills | Status: AC

## 2018-04-22 NOTE — Progress Notes (Signed)
OT Cancellation Note  Patient Details Name: Alexandra Powell MRN: [id number] DOB: [birth year]   Cancelled Treatment:    Reason Eval/Treat Not Completed: OT screened, no needs identified, will sign off. Per discussion with PT and patient, pt is near/at baseline with ADLs. Reports she feels symptoms have resolved. Will sign off.   Tyrone Schimke, OT Acute Rehabilitation Services Pager: [phone] Office: [phone]  04/22/2018, 9:40 AM

## 2018-04-22 NOTE — Care Management (Addendum)
Notified UR to determine if patient needs CC44, no response.

## 2018-04-22 NOTE — Evaluation (Signed)
Speech Language Pathology Evaluation Patient Details Name: Alexandra Powell MRN: [id number] DOB: [birth year] Today's Date: 04/22/2018 Time: 5852-7782 SLP Time Calculation (min) (ACUTE ONLY): 17 min  Problem List:  Patient Active Problem List   Diagnosis Date Noted  . Stroke (cerebrum) ([place]) 04/21/2018  . Hypothyroidism (acquired)   . GERD (gastroesophageal reflux disease)   . Lacunar infarct, acute [facility])    Past Medical History:  Past Medical History:  Diagnosis Date  . GERD (gastroesophageal reflux disease)   . Hypothyroidism (acquired)   . Vitamin D deficiency    Past Surgical History:  Past Surgical History:  Procedure Laterality Date  . HYSTERECTOMY ABDOMINAL WITH SALPINGO-OOPHORECTOMY     remote ovarian cancer  . TONSILLECTOMY     HPI:  Patient is a 73 y/o female who presents with left weakness and decreased sensation LUE/face. NIH:1. Brain MRI- acute lacunar infarct central right thalamus. PMH includes hypothyroidism.    Assessment / Plan / Recommendation Clinical Impression   Patient presents with cognitive-linguistic function within normal limits. Speech is fluent and clear without dysarthria, expressive and receptive language intact for complex conversation. SLP administered MOCA-Basic; pt scored 30/30, indicating cognitive function within normal limits (26 and above is considered normal). Pt able to problem solve functional scenarios without difficulty. Education completed and no further skilled ST needs identified; SLP will s/o.    SLP Assessment  SLP Recommendation/Assessment: Patient does not need any further Speech Lanaguage Pathology Services SLP Visit Diagnosis: Cognitive communication deficit (R41.841)    Follow Up Recommendations  None    Frequency and Duration           SLP Evaluation Cognition  Overall Cognitive Status: Within Functional Limits for tasks assessed Arousal/Alertness: Awake/alert Orientation Level: Oriented X4 Attention:  Alternating Alternating Attention: Appears intact Memory: Appears intact Awareness: Appears intact Problem Solving: Appears intact Executive Function: Reasoning;Organizing;Sequencing Reasoning: Appears intact Sequencing: Appears intact Organizing: Appears intact Safety/Judgment: Appears intact       Comprehension  Auditory Comprehension Overall Auditory Comprehension: Appears within functional limits for tasks assessed Yes/No Questions: Within Functional Limits Commands: Within Functional Limits Conversation: Complex Visual Recognition/Discrimination Discrimination: Within Function Limits Reading Comprehension Reading Status: Not tested    Expression Expression Primary Mode of Expression: Verbal Verbal Expression Overall Verbal Expression: Appears within functional limits for tasks assessed Initiation: No impairment Automatic Speech: Name;Social Response Level of Generative/Spontaneous Verbalization: Conversation Repetition: No impairment Naming: No impairment Pragmatics: No impairment Written Expression Dominant Hand: Left Written Expression: Not tested   Oral / Motor  Oral Motor/Sensory Function Overall Oral Motor/Sensory Function: Within functional limits Motor Speech Overall Motor Speech: Appears within functional limits for tasks assessed   [place], [place], [place] Pager: [phone] Office: [phone]  Aliene Altes 04/22/2018, 11:34 AM

## 2018-04-22 NOTE — Discharge Summary (Addendum)
**Note De-Identified vi Obfusction** Physicin Dischrge Summry  DESIRY ORCHRD HBZ:[id number] DOB: February 04, 1946 DO: 04/21/2018  PCP: Thress Sheller, MD (Inctive)  dmit dte: 04/21/2018 Dischrge dte: 04/22/2018  Time spent: 40 minutes  Recommendtions for Outptient Follow-up:  1. Follow up outptient CBC/CMP 2. Ensure on DPT x3 weeks, then plvix lone 3. Ensure neurology follow up outptient 4. Follow up repet thyroid function tests outptient 5. Follow predibetes outptient    Dischrge Dignoses:  Principl Problem:   Stroke (cerebrum) ([place]) ctive Problems:   Hypothyroidism (c[place])   GERD (gstroesophgel reflux disese)   Lcunr infrct, cute ([place])   Dischrge Condition: stble  Diet recommendtion: hert helthy  Filed Weights   04/21/18 1005  Weight: 101.6 kg    History of present illness:  Alexandra Powell is  73 y.o. femle with medicl history significnt of hypothyroidism presenting with left-sided numbness.  She felt fine this M upon wkening.   few minutes lter she felt relly funny.  She felt sort of distnt, but ws not dizzy.  She felt hevy.  She lid down nd noticed left rm nd fce wekness.  Her BP ws 160/ nd she does not hve known HTN.  Symptoms continued nd so they clled 911.  EMS BP ws 210/.  No dysphgi or dysrthri.  She still feels slight left rm numbness.  Fcil symptoms hve resolved.  No fcil droop.  She ws dmitted for stroke like symptoms nd found to hve smll cute lcunr infrct in the centrl right thlmus.  Neurology ws consulted thought most likely 2/2 smll vessel disese nd recommended DPT x 3 weeks, then plvix lone.  Pt strted on crestor.  Stble nd bck to bseline on dy of dischrge.  Hospitl Course:   cute Lcunr Infrct in Centrl R Thlmus - Presented with L rm/fce numbness - Sx resolved tody - Neurology suspects 2/2 smll vessel disese - CT hed/neck without extrcrnil or intrcrnil flow limiting stenosis  or dissection - MRI brin with cute lcunr infrct  - Echo with norml EF, distolic dysfunction (see report) - LDL 155, strted on crestor - 1c, 6, predibetes, outptient follow up - PT/OT/SLP with no recommendtions for outptient follow up - tele sinus - follow up with neurology outptient, pln for DPT x 3 weeks, then plvix lone  Hypertension: lbile here, no forml dignosis, elevted t presenttion, continue to monitor fter dischrge  GERD -Continue PPI  Hypothyroidism - TSH low here, follow repet outptient nd consider djusting synthroid bsed on repet   Procedures: Echo IMPRESSIONS    1. Norml left ventriculr size nd systolic function. Mild (grde 1) distolic dysfunction.  2. Norml left tril size.  3. Norml right tril size.  4. No tril level shunt detected by color flow Doppler.  Consulttions:  neurolgoy  Dischrge Exm: Vitls:   04/22/18 0400 04/22/18 0756  BP: 107/63 140/72  Pulse: 72 77  Resp: 16 18  Temp: 98 F (36.7 C) 98.1 F (36.7 C)  SpO2: 98% 92%   t presenttion, she notes, things felt distnt, then L rm, fce numbness Presented due to this, now symtpoms resolved Redy to go home  Generl: No cute distress. Crdiovsculr: Hert sounds show  regulr rte, nd rhythm.  Lungs: Cler to usculttion bilterlly  bdomen: Soft, nontender, nondistended  Neurologicl: lert nd oriented 3. Moves ll extremities 4 with equl strength. Crnil nerves II through XII intct. Skin: Wrm nd dry. No rshes or lesions. Extremities: No clubbing or cynosis. No edem.  Psychitric: Mood nd ffect re norml. **Note De-Identified vi Obfusction** Insight nd judgment re pproprite.  Dischrge Instructions   Dischrge Instructions    Ambultory referrl to Neurology   Complete by:  As directed    An ppointment is requested in pproximtely: 4 weeks   Cll MD for:  difficulty brething, hedche or visul disturbnces   Complete by:  As directed     Cll MD for:  extreme ftigue   Complete by:  As directed    Cll MD for:  hives   Complete by:  As directed    Cll MD for:  persistnt dizziness or light-hededness   Complete by:  As directed    Cll MD for:  persistnt nuse nd vomiting   Complete by:  As directed    Cll MD for:  redness, tenderness, or signs of infection (pin, swelling, redness, odor or green/yellow dischrge round incision site)   Complete by:  As directed    Cll MD for:  severe uncontrolled pin   Complete by:  As directed    Cll MD for:  temperture >100.4   Complete by:  As directed    Diet - low sodium hert helthy   Complete by:  As directed    Dischrge instructions   Complete by:  As directed    You hd  stroke.  Plese tke spirin nd plvix for the next 3 weeks, then continue plvix lone.  We've strted you on crestor for cholesterol nd to help prevent strokes.    You hve predibetes, plese follow up with your PCP regrding this.  You hd bnorml thyroid function tests.  Plese follow up  repet s n outptient to see if your synthroid dose needs to be djusted.  Plese follow up your blood pressure with your primry cre provider s n outptient.  Plese follow up with neurology s n outptient.  Return for new, recurrent, or worsening symptoms.  Plese sk your PCP to request records from this hospitliztion so they know wht ws done nd wht the next steps will be.   Increse ctivity slowly   Complete by:  As directed      Allergies s of 04/22/2018      Rections   Erythromycin    "stomch on fire"   Cephlosporins Rsh   Lipitor [torvsttin] Other (See Comments)   Joint cheness      Mediction List    STOP tking these medictions   ibuprofen 600 MG tblet Commonly known s:  ADVIL,MOTRIN     TAKE these medictions   spirin EC 81 MG tblet Tke 1 tblet (81 mg totl) by mouth dily for 21 dys.   cetirizine 5 MG tblet Commonly known s:   ZYRTEC Tke 5 mg by mouth dily.   clopidogrel 75 MG tblet Commonly known s:  PLAVIX Tke 1 tblet (75 mg totl) by mouth dily for 30 dys.   co-enzyme Q-10 30 MG cpsule Tke 30 mg by mouth dily.   Fish Oil 500 MG Cps Tke by mouth.   levothyroxine 112 MCG tblet Commonly known s:  SYNTHROID, LEVOTHROID Tke 112 mcg by mouth dily before brekfst.   Mgnesium 250 MG Tbs Tke 250 mg by mouth dily.   multivitmin with minerls tblet Tke 1 tblet by mouth dily.   omeprzole 20 MG cpsule Commonly known s:  PRILOSEC Tke 20 mg by mouth dily.   rosuvsttin 40 MG tblet Commonly known s:  CRESTOR Tke 1 tblet (40 mg totl) by mouth dily t 6 PM for 30 dys.  Vitamin D (Ergocalciferol) 1.25 MG (50000 UT) Caps capsule Commonly known as:  DRISDOL Take 50,000 Units by mouth every 7 (seven) days. Pt takes on Friday of each week      Allergies  Allergen Reactions  . Erythromycin     "stomach on fire"  . Cephalosporins Rash  . Lipitor [Atorvastatin] Other (See Comments)    Joint acheness      The results of significant diagnostics from this hospitalization (including imaging, microbiology, ancillary and laboratory) are listed below for reference.    Significant Diagnostic Studies: Ct Angio Head W Or Wo Contrast  Result Date: 04/21/2018 CLINICAL DATA:  LEFT-sided facial numbness, slowly resolving. EXAM: CT ANGIOGRAPHY HEAD AND NECK TECHNIQUE: Multidetector CT imaging of the head and neck was performed using the standard protocol during bolus administration of intravenous contrast. Multiplanar CT image reconstructions and MIPs were obtained to evaluate the vascular anatomy. Carotid stenosis measurements (when applicable) are obtained utilizing NASCET criteria, using the distal internal carotid diameter as the denominator. CONTRAST:  59mL ISOVUE-370 IOPAMIDOL (ISOVUE-370) INJECTION 76% COMPARISON:  CT head earlier today. FINDINGS: CTA NECK FINDINGS Aortic arch:  Standard branching. Imaged portion shows no evidence of aneurysm or dissection. No significant stenosis of the major arch vessel origins. Aortic atherosclerosis. Right carotid system: No evidence of dissection, stenosis (50% or greater) or occlusion. Left carotid system: No evidence of dissection, stenosis (50% or greater) or occlusion. Vertebral arteries: LEFT vertebral dominant. Both patent. No flow-limiting ostial stenosis or narrowing in the. Skeleton: Spondylosis. No worrisome osseous lesion. Other neck: No masses. Upper chest: Clear. Review of the MIP images confirms the above findings CTA HEAD FINDINGS Anterior circulation: Calcification of the cavernous internal carotid arteries consistent with cerebrovascular atherosclerotic disease. No signs of intracranial large vessel occlusion or flow-limiting stenosis. Posterior circulation: Calcification of the distal vertebral arteries consistent with cerebrovascular atherosclerotic disease. LEFT vertebral dominant. No signs of intracranial large vessel occlusion or flow-limiting stenosis. Venous sinuses: As permitted by contrast timing, patent. Anatomic variants: None of significance Delayed phase: Not performed. Review of the MIP images confirms the above findings IMPRESSION: No extracranial or intracranial flow-limiting stenosis or dissection. Aortic Atherosclerosis (ICD10-I70.0). These results were called by telephone at the time of interpretation on 04/21/2018 at 10:59 am to Dr. Amie [place] , who verbally acknowledged these results. Electronically Signed   By: Staci Righter M.D.   On: 04/21/2018 11:15   Ct Angio Neck W Or Wo Contrast  Result Date: 04/21/2018 CLINICAL DATA:  LEFT-sided facial numbness, slowly resolving. EXAM: CT ANGIOGRAPHY HEAD AND NECK TECHNIQUE: Multidetector CT imaging of the head and neck was performed using the standard protocol during bolus administration of intravenous contrast. Multiplanar CT image reconstructions and MIPs were  obtained to evaluate the vascular anatomy. Carotid stenosis measurements (when applicable) are obtained utilizing NASCET criteria, using the distal internal carotid diameter as the denominator. CONTRAST:  34mL ISOVUE-370 IOPAMIDOL (ISOVUE-370) INJECTION 76% COMPARISON:  CT head earlier today. FINDINGS: CTA NECK FINDINGS Aortic arch: Standard branching. Imaged portion shows no evidence of aneurysm or dissection. No significant stenosis of the major arch vessel origins. Aortic atherosclerosis. Right carotid system: No evidence of dissection, stenosis (50% or greater) or occlusion. Left carotid system: No evidence of dissection, stenosis (50% or greater) or occlusion. Vertebral arteries: LEFT vertebral dominant. Both patent. No flow-limiting ostial stenosis or narrowing in the. Skeleton: Spondylosis. No worrisome osseous lesion. Other neck: No masses. Upper chest: Clear. Review of the MIP images confirms the above findings CTA HEAD FINDINGS Anterior **Note De-Identified vi Obfusction** circultion: Clcifiction of the cvernous internl crotid rteries consistent with cerebrovsculr therosclerotic disese. No signs of intrcrnil lrge vessel occlusion or flow-limiting stenosis. Posterior circultion: Clcifiction of the distl vertebrl rteries consistent with cerebrovsculr therosclerotic disese. LEFT vertebrl dominnt. No signs of intrcrnil lrge vessel occlusion or flow-limiting stenosis. Venous sinuses: As permitted by contrst timing, ptent. Antomic vrints: None of significnce Delyed phse: Not performed. Review of the MIP imges confirms the bove findings IMPRESSION: No extrcrnil or intrcrnil flow-limiting stenosis or dissection. Aortic Atherosclerosis (ICD10-I70.0). These results were clled by telephone t the time of interprettion on 04/21/2018 t 10:59 m to Dr. Amie Portlnd , who verblly cknowledged these results. Electroniclly Signed   By: Stci Righter M.D.   On: 04/21/2018 11:15   Mr Brin Wo  Contrst  Result Dte: 04/21/2018 CLINICAL DATA:  31 yer old femle code stroke presenttion with left side fcil numbness. EXAM: MRI HEAD WITHOUT CONTRAST TECHNIQUE: Multiplnr, multiecho pulse sequences of the brin nd surrounding structures were obtined without intrvenous contrst. COMPARISON:  CT hed, CTA hed nd neck erlier tody. FINDINGS: Brin: Liner restricted diffusion in the centrl right thlmus on series 4, imge 24. Fint ssocited T2 nd FLAIR hyperintensity. No hemorrhge or mss effect. No other restricted diffusion. No midline shift, mss effect, evidence of mss lesion, ventriculomegly, extr-xil collection or cute intrcrnil hemorrhge. Cervicomedullry junction nd pituitry re within norml limits. Ptchy nd occsionlly confluent bilterl cerebrl white mtter T2 nd FLAIR hyperintensity. Two chronic microhemorrhges re identified, in the left midbrin (series 11, imge 55) nd right temporl lobe (imge 59). No corticl encephlomlci. The bsl gngli, left thlmus, pons nd cerebellum re within norml limits. Vsculr: Mjor intrcrnil vsculr flow voids re preserved. Skull nd upper cervicl spine: Negtive visible cervicl spine. Visulized bone mrrow signl is within norml limits. Sinuses/Orbits: Negtive. Other: Visible internl uditory structures pper norml. Sclp nd fce soft tissues re within norml limits. Accidentlly,  prtil intrcrnil MRA ws performed nd demonstrtes ntegrde flow in the visible nterior nd posterior circultion with no gross bnormlity side from mild intrcrnil (mostly ICA siphon) tortuosity. Also ccidentlly, coronl T2 imges were not obtined. IMPRESSION: 1. Smll cute lcunr infrct in the centrl right thlmus. No cute hemorrhge or mss effect. 2. Underlying mild to moderte for ge chronic smll vessel disese suspected, including occsionl chronic micro-hemorrhges. Electroniclly Signed   By: Genevie Ann M.D.   On: 04/21/2018 14:13   Ct Hed Code Stroke Wo Contrst  Result Dte: 04/21/2018 CLINICAL DATA:  Code stroke.  LEFT sided fcil numbness. EXAM: CT HEAD WITHOUT CONTRAST TECHNIQUE: Contiguous xil imges were obtined from the bse of the skull through the vertex without intrvenous contrst. COMPARISON:  None. FINDINGS: Brin: No evidence of cute infrction, hemorrhge, hydrocephlus, extr-xil collection or mss lesion/mss effect. Norml for ge cerebrl volume. Slight hypottenution of white mtter, likely smll vessel disese. Chronic LEFT cerebellr hemispheric infrct, with encephlomlci. Vsculr: Clcifiction of the cvernous internl crotid rteries consistent with cerebrovsculr therosclerotic disese. No signs of intrcrnil lrge vessel occlusion. Skull: Clvrium intct. Sinuses/Orbits: Unremrkble. Other: No middle er or mstoid fluid. ASPECTS [facility] Stroke Progrm Erly CT Score) - Gnglionic level infrction (cudte, lentiform nuclei, internl cpsule, insul, M1-M3 cortex): 7 - Suprgnglionic infrction (M4-M6 cortex): 3 Totl score (0-10 with 10 being norml): 10 IMPRESSION: 1. Norml for ge cerebrl volume. No cute intrcrnil findings. Chronic LEFT cerebellr hemispheric infrct. Clcific cerebrovsculr therosclerotic disese of the crotid siphons. 2. ASPECTS is 10. These results were communicted to Dr. Rory Percy t 10:29 mon 2/1/2020by  text page via the [facility] messaging system. Electronically Signed   By: Staci Righter M.D.   On: 04/21/2018 10:34    Microbiology: No results found for this or any previous visit (from the past 240 hour(s)).   Labs: Basic Metabolic Panel: Recent Labs  Lab 04/21/18 1057 04/21/18 1102  NA 139  --   K 3.8  --   CL 104  --   CO2 25  --   GLUCOSE 102*  --   BUN 14  --   CREATININE 0.92 0.90  CALCIUM 9.0  --    Liver Function Tests: Recent Labs  Lab 04/21/18 1057  AST 22  ALT 24  ALKPHOS 77  BILITOT 0.5   PROT 6.9  ALBUMIN 3.3*   No results for input(s): LIPASE, AMYLASE in the last 168 hours. No results for input(s): AMMONIA in the last 168 hours. CBC: Recent Labs  Lab 04/21/18 1057  WBC 5.7  NEUTROABS 3.5  HGB 12.5  HCT 39.1  MCV 87.5  PLT 194   Cardiac Enzymes: No results for input(s): CKTOTAL, CKMB, CKMBINDEX, TROPONINI in the last 168 hours. BNP: BNP (last 3 results) No results for input(s): BNP in the last 8760 hours.  ProBNP (last 3 results) No results for input(s): PROBNP in the last 8760 hours.  CBG: Recent Labs  Lab 04/21/18 1052  GLUCAP 94       Signed:  Fayrene Helper MD.  [facility] 04/22/2018, 6:07 PM

## 2018-04-22 NOTE — Progress Notes (Signed)
  Echocardiogram 2D Echocardiogram has been performed.  Alexandra Powell 04/22/2018, 12:29 PM

## 2018-04-22 NOTE — Progress Notes (Signed)
Pt given discharge summary and went home via family transportation.

## 2018-04-22 NOTE — Progress Notes (Signed)
STROKE TEAM PROGRESS NOTE   HISTORY OF PRESENT ILLNESS (per record) Alexandra Powell is a 73 y.o. female past medical history of hypothyroidism and gastroesophageal reflux, who was in her usual state of health when she woke up this morning around 8 AM and soon after after he went to the bathroom started feeling what she describes as a "funny feeling".  Very soon after that within a few minutes she started feeling some numbness on the left side of her face and left arm.  Her left arm felt a little heavy.  She had no slurred speech.  She had no facial droop according to her.  EMS was called.  Her blood pressures were significantly elevated with systolics over 371I.  She was brought into the emergency room.  She was evaluated by the ED provider.  Minimal symptoms but last known well being within 4 hours, code stroke was activated. She confirmed the history as provided above.  She also says that most of her symptoms especially in the face have resolved but she has still some residual numbness in the left arm. No weakness.  No headache.  No double vision but says her vision might be blurry both ways but that might just be because she is not wearing her glasses. Denies chest pain shortness breath nausea vomiting.  Denies proceeding with sicknesses.   LKW: 8 AM on 04/21/2018 tpa given?: no, NIH 1 for sensory Premorbid modified Rankin scale (mRS): 0   SUBJECTIVE (INTERVAL HISTORY) Her family is not at bedside. Reviewed stroke and causes. Reviewed images with patient. She need DUAP for 3 weeks then plavix. She is concerned about the cost of plavix.     OBJECTIVE Vitals:   04/21/18 2300 04/21/18 2353 04/22/18 0400 04/22/18 0756  BP: (!) 108/46 (!) 110/49 107/63 140/72  Pulse: 69 64 72 77  Resp: (!) 27 18 16 18   Temp: 98.4 F (36.9 C) 98.2 F (36.8 C) 98 F (36.7 C) 98.1 F (36.7 C)  TempSrc: Oral Oral Oral Oral  SpO2: 95% 94% 98% 92%  Weight:      Height:        CBC:  Recent Labs  Lab  04/21/18 1057  WBC 5.7  NEUTROABS 3.5  HGB 12.5  HCT 39.1  MCV 87.5  PLT 967    Basic Metabolic Panel:  Recent Labs  Lab 04/21/18 1057 04/21/18 1102  NA 139  --   K 3.8  --   CL 104  --   CO2 25  --   GLUCOSE 102*  --   BUN 14  --   CREATININE 0.92 0.90  CALCIUM 9.0  --     Lipid Panel:     Component Value Date/Time   CHOL 244 (H) 04/22/2018 0551   TRIG 228 (H) 04/22/2018 0551   HDL 43 04/22/2018 0551   CHOLHDL 5.7 04/22/2018 0551   VLDL 46 (H) 04/22/2018 0551   LDLCALC 155 (H) 04/22/2018 0551   HgbA1c:  Lab Results  Component Value Date   HGBA1C 6.0 (H) 04/22/2018   Urine Drug Screen:     Component Value Date/Time   LABOPIA NONE DETECTED 04/21/2018 1251   COCAINSCRNUR NONE DETECTED 04/21/2018 1251   LABBENZ NONE DETECTED 04/21/2018 1251   AMPHETMU NONE DETECTED 04/21/2018 1251   THCU NONE DETECTED 04/21/2018 1251   LABBARB NONE DETECTED 04/21/2018 1251    Alcohol Level     Component Value Date/Time   ETH <10 04/21/2018 1057    IMAGING  Ct Angio Head W Or Wo Contrast Ct Angio Neck W Or Wo Contrast 04/21/2018 IMPRESSION:  No extracranial or intracranial flow-limiting stenosis or dissection. Aortic Atherosclerosis (ICD10-I70.0).     Mr Brain Wo Contrast 04/21/2018 IMPRESSION:  1. Small acute lacunar infarct in the central right thalamus. No acute hemorrhage or mass effect.  2. Underlying mild to moderate for age chronic small vessel disease suspected, including occasional chronic micro-hemorrhages.   Ct Head Code Stroke Wo Contrast 04/21/2018 IMPRESSION:  1. Normal for age cerebral volume. No acute intracranial findings. Chronic LEFT cerebellar hemispheric infarct. 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  2. ASPECTS is 10.     Transthoracic Echocardiogram  00/00/00  1. Normal left ventricular size and systolic function. Mild (grade 1) diastolic dysfunction.  2. Normal left atrial size.  3. Normal right  atrial size.  4. No atrial level shunt detected by color flow Doppler.     PHYSICAL EXAM Blood pressure 140/72, pulse 77, temperature 98.1 F (36.7 C), temperature source Oral, resp. rate 18, height 5\' 2"  (1.575 m), weight 101.6 kg, SpO2 92 %.  Exam: NAD, pleasant                  Speech:    Speech is normal; fluent and spontaneous with normal comprehension.  Cognition:    The patient is oriented to person, place, and time;     recent and remote memory intact;     language fluent;    Cranial Nerves:    The pupils are equal, round, and reactive to light.Trigeminal sensation is intact and the muscles of mastication are normal. The face is symmetric. The palate elevates in the midline. Hearing intact. Voice is normal. Shoulder shrug is normal. The tongue has normal motion without fasciculations.   Coordination:  No dysmetria  Motor Observation:    No asymmetry, no atrophy, and no involuntary movements noted. Tone:    Normal muscle tone.     Strength:    Strength is V/V in the upper and lower limbs.      Sensation: intact to LT; resolved left arm numbness    ASSESSMENT/PLAN [name] is a 73 y.o. female with history of hypothyroidism, previous stroke by imaging, and gastroesophageal reflux, presenting with elevated blood pressure and left sided numbness / weakness. She did not receive IV t-PA due to improving deficits.  Stroke: small acute lacunar infarct in the central right thalamus - likely small vessel disease.  Resultant  No deficits  CT head - No acute intracranial findings. Chronic LEFT cerebellar hemispheric infarct.  MRI head - small acute lacunar infarct in the central right thalamus  MRA head - not performed  CTA H&N  - unremarkable Carotid Doppler - CTA neck performed - carotid dopplers not indicated.  2D Echo - no pfo, no thrombus or emboli, no shunting  LDL - 155  HgbA1c - 6.0  UDS - negative  VTE prophylaxis - SCDs  Diet  - Heart  healthy with thin liquids.  No antithrombotic prior to admission, now on aspirin 325 mg daily. DUAP therapy for 3 weeks then plavix alone.  Patient counseled to be compliant with her antithrombotic medications  Ongoing aggressive stroke risk factor management  Therapy recommendations:  No PT F/U recommended  Disposition:  Pending  Hypertension  Blood pressure somewhat low at times  . Permissive hypertension (OK if < 220/120) but gradually normalize in 5-7 days . Long-term BP goal normotensive  Hyperlipidemia  Lipid lowering  medication PTA:  none  LDL 155, goal < 70  Current lipid lowering medication: now on Crestor 40 mg daily  (Lipitor allergy - joint pain)  Continue statin at discharge    Other Stroke Risk Factors  Advanced age  Obesity, Body mass index is 40.97 kg/m., recommend weight loss, diet and exercise as appropriate   Hx stroke/TIA (by imaging)  Family hx stroke (mother and grandfather)   Other Active Problems  Labile BP  Stroke will sign off. Patient will need aggressive risk factor management. ASA 81mg  and Plavix 75mg  for 3 weeks then Plavix alone.   Hospital day # 1  Personally examined patient and images, and have participated in and made any corrections needed to history, physical, neuro exam,assessment and plan as stated above.  I have personally obtained the history, evaluated lab date, reviewed imaging studies and agree with radiology interpretations.    Sarina Ill, MD Stroke Neurology   A total of 25 minutes was spent for the care of this patient, spent on counseling patient and family on different diagnostic and therapeutic options, counseling and coordination of care, riskd ans benefits of management, compliance, or risk factor reduction and education.   To contact Stroke Continuity provider, please refer to [email or web address] After hours, contact General Neurology

## 2018-04-22 NOTE — Evaluation (Addendum)
Physical Therapy Evaluation Patient Details Name: Alexandra Powell MRN: [id number] DOB: March 09, 1946 Today's Date: 04/22/2018   History of Present Illness  Patient is a 73 y/o female who presents with left weakness and decreased sensation LUE/face. NIH:1. Brain MRI- acute lacunar infarct central right thalamus. PMH includes hypothyroidism.   Clinical Impression  Patient tolerated gait training and stair training with supervision for safety. Reports all deficits have resolved. Education re: signs/symptoms of stroke. 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 Encouraged walking with nursing while in the hospital. Pt does not require skilled therapy services as pt functioning at baseline. All education completed. Discharge from therapy.  I have discussed the patient's current level of function related to stroke with the patient. She has supportive spouse at home and acknowledges understanding of her condition and feels they can provide the level of care the patient will need at home.         Follow Up Recommendations No PT follow up;Supervision - Intermittent    Equipment Recommendations  None recommended by PT    Recommendations for Other Services       Precautions / Restrictions Precautions Precautions: None Restrictions Weight Bearing Restrictions: No      Mobility  Bed Mobility Overal bed mobility: Modified Independent                Transfers Overall transfer level: Needs assistance Equipment used: None Transfers: Sit to/from Stand Sit to Stand: Supervision         General transfer comment: Supervision for safety.   Ambulation/Gait Ambulation/Gait assistance: Supervision Gait Distance (Feet): 200 Feet Assistive device: None Gait Pattern/deviations: Step-through pattern;Decreased stride length;Wide base of support Gait velocity: decreased Gait velocity interpretation: 1.31 - 2.62 ft/sec, indicative of limited community ambulator General Gait  Details: Slow, waddling like gait with supervision. No overt LOB.   Stairs Stairs: Yes Stairs assistance: Supervision Stair Management: Two rails;Step to pattern Number of Stairs: 3(+2 steps x2) General stair comments: Cues for safety/lines.   Wheelchair Mobility    Modified Rankin (Stroke Patients Only) Modified Rankin (Stroke Patients Only) Pre-Morbid Rankin Score: No significant disability Modified Rankin: No significant disability     Balance Overall balance assessment: No apparent balance deficits (not formally assessed)                                           Pertinent Vitals/Pain Pain Assessment: No/denies pain    Home Living Family/patient expects to be discharged to:: Private residence Living Arrangements: Spouse/significant other Available Help at Discharge: Family;Available PRN/intermittently Type of Home: House Home Access: Stairs to enter Entrance Stairs-Rails: Right Entrance Stairs-Number of Steps: 2 Home Layout: One level Home Equipment: Walker - 2 wheels;Bedside commode;Shower seat      Prior Function Level of Independence: Independent         Comments: Drives. Retired Marine scientist.      Hand Dominance   Dominant Hand: Left    Extremity/Trunk Assessment   Upper Extremity Assessment Upper Extremity Assessment: Defer to OT evaluation    Lower Extremity Assessment Lower Extremity Assessment: Overall WFL for tasks assessed       Communication   Communication: No difficulties  Cognition Arousal/Alertness: Awake/alert Behavior During Therapy: WFL for tasks assessed/performed Overall Cognitive Status: Within Functional Limits for tasks assessed  General Comments      Exercises     Assessment/Plan    PT Assessment Patent does not need any further PT services  PT Problem List         PT Treatment Interventions      PT Goals (Current goals can be found in the  Care Plan section)  Acute Rehab PT Goals Patient Stated Goal: to go home PT Goal Formulation: All assessment and education complete, DC therapy    Frequency     Barriers to discharge        Co-evaluation               AM-PAC PT "6 Clicks" Mobility  Outcome Measure Help needed turning from your back to your side while in a flat bed without using bedrails?: None Help needed moving from lying on your back to sitting on the side of a flat bed without using bedrails?: None Help needed moving to and from a bed to a chair (including a wheelchair)?: None Help needed standing up from a chair using your arms (e.g., wheelchair or bedside chair)?: None Help needed to walk in hospital room?: None Help needed climbing 3-5 steps with a railing? : A Little 6 Click Score: 23    End of Session Equipment Utilized During Treatment: Gait belt Activity Tolerance: Patient tolerated treatment well Patient left: in chair;with call bell/phone within reach Nurse Communication: Mobility status PT Visit Diagnosis: Difficulty in walking, not elsewhere classified (R26.2)    Time: 8315-1761 PT Time Calculation (min) (ACUTE ONLY): 20 min   Charges:   PT Evaluation $PT Eval Low Complexity: 1 Low          Wray Kearns, PT, DPT Acute Rehabilitation Services Pager [phone] Office [place] 04/22/2018, 9:00 AM

## 2018-04-24 DIAGNOSIS — K219 Gastro-esophageal reflux disease without esophagitis: Secondary | ICD-10-CM | POA: Diagnosis not present

## 2018-04-24 DIAGNOSIS — Z8673 Personal history of transient ischemic attack (TIA), and cerebral infarction without residual deficits: Secondary | ICD-10-CM | POA: Diagnosis not present

## 2018-04-24 DIAGNOSIS — R7309 Other abnormal glucose: Secondary | ICD-10-CM | POA: Diagnosis not present

## 2018-04-24 DIAGNOSIS — E039 Hypothyroidism, unspecified: Secondary | ICD-10-CM | POA: Diagnosis not present

## 2018-04-24 DIAGNOSIS — I6529 Occlusion and stenosis of unspecified carotid artery: Secondary | ICD-10-CM | POA: Diagnosis not present

## 2018-04-30 ENCOUNTER — Other Ambulatory Visit: Payer: Self-pay | Admitting: Internal Medicine

## 2018-04-30 DIAGNOSIS — Z1231 Encounter for screening mammogram for malignant neoplasm of breast: Secondary | ICD-10-CM

## 2018-05-15 DIAGNOSIS — E785 Hyperlipidemia, unspecified: Secondary | ICD-10-CM | POA: Diagnosis not present

## 2018-05-15 DIAGNOSIS — R7309 Other abnormal glucose: Secondary | ICD-10-CM | POA: Diagnosis not present

## 2018-05-15 DIAGNOSIS — E039 Hypothyroidism, unspecified: Secondary | ICD-10-CM | POA: Diagnosis not present

## 2018-05-15 DIAGNOSIS — Z8673 Personal history of transient ischemic attack (TIA), and cerebral infarction without residual deficits: Secondary | ICD-10-CM | POA: Diagnosis not present

## 2018-05-20 DIAGNOSIS — C801 Malignant (primary) neoplasm, unspecified: Secondary | ICD-10-CM

## 2018-05-20 HISTORY — DX: Malignant (primary) neoplasm, unspecified: C80.1

## 2018-05-29 ENCOUNTER — Encounter: Payer: Self-pay | Admitting: Neurology

## 2018-05-29 ENCOUNTER — Ambulatory Visit (INDEPENDENT_AMBULATORY_CARE_PROVIDER_SITE_OTHER): Payer: Medicare Other | Admitting: Neurology

## 2018-05-29 VITALS — BP 140/69 | HR 72 | Ht 62.0 in | Wt 216.2 lb

## 2018-05-29 DIAGNOSIS — R202 Paresthesia of skin: Secondary | ICD-10-CM | POA: Diagnosis not present

## 2018-05-29 DIAGNOSIS — I639 Cerebral infarction, unspecified: Secondary | ICD-10-CM

## 2018-05-29 DIAGNOSIS — I6381 Other cerebral infarction due to occlusion or stenosis of small artery: Secondary | ICD-10-CM

## 2018-05-29 MED ORDER — CLOPIDOGREL BISULFATE 75 MG PO TABS: 75.0000 mg | ORAL_TABLET | Freq: Every day | ORAL | 11 refills | Status: AC

## 2018-05-29 NOTE — Patient Instructions (Signed)
I had a long d/w patient about his recent lacunar stroke, risk for recurrent stroke/TIAs, personally independently reviewed imaging studies and stroke evaluation results and answered questions.Continue 75 mg daily alone and stop aspirin for secondary stroke prevention and maintain strict control of hypertension with blood pressure goal below 130/90, diabetes with hemoglobin A1c goal below 6.5% and lipids with LDL cholesterol goal below 70 mg/dL. I also advised the patient to eat a healthy diet with plenty of whole grains, cereals, fruits and vegetables, exercise regularly and maintain ideal body weight.  I recommend she increase the dose of CoQ10 to 200 mg daily and have follow-up lipid profile checked in 1 weeks with her primary physician.  If her cholesterol is suboptimally controlled or she is unable to tolerate Crestor may need to consider the new PCSK9 inhibitor injections in the future.  Followup in the future with my nurse practitioner Janett Billow in 6 months or call earlier if necessary    Stroke Prevention Some medical conditions and behaviors are associated with a higher chance of having a stroke. You can help prevent a stroke by making nutrition, lifestyle, and other changes, including managing any medical conditions you may have. What nutrition changes can be made?   Eat healthy foods. You can do this by: ? Choosing foods high in fiber, such as fresh fruits and vegetables and whole grains. ? Eating at least 5 or more servings of fruits and vegetables a day. Try to fill half of your plate at each meal with fruits and vegetables. ? Choosing lean protein foods, such as lean cuts of meat, poultry without skin, fish, tofu, beans, and nuts. ? Eating low-fat dairy products. ? Avoiding foods that are high in salt (sodium). This can help lower blood pressure. ? Avoiding foods that have saturated fat, trans fat, and cholesterol. This can help prevent high cholesterol. ? Avoiding processed and premade  foods.  Follow your health care provider's specific guidelines for losing weight, controlling high blood pressure (hypertension), lowering high cholesterol, and managing diabetes. These may include: ? Reducing your daily calorie intake. ? Limiting your daily sodium intake to 1,500 milligrams (mg). ? Using only healthy fats for cooking, such as olive oil, canola oil, or sunflower oil. ? Counting your daily carbohydrate intake. What lifestyle changes can be made?  Maintain a healthy weight. Talk to your health care provider about your ideal weight.  Get at least 30 minutes of moderate physical activity at least 5 days a week. Moderate activity includes brisk walking, biking, and swimming.  Do not use any products that contain nicotine or tobacco, such as cigarettes and e-cigarettes. If you need help quitting, ask your health care provider. It may also be helpful to avoid exposure to secondhand smoke.  Limit alcohol intake to no more than 1 drink a day for nonpregnant women and 2 drinks a day for men. One drink equals 12 oz of beer, 5 oz of wine, or 1 oz of hard liquor.  Stop any illegal drug use.  Avoid taking birth control pills. Talk to your health care provider about the risks of taking birth control pills if: ? You are over 47 years old. ? You smoke. ? You get migraines. ? You have ever had a blood clot. What other changes can be made?  Manage your cholesterol levels. ? Eating a healthy diet is important for preventing high cholesterol. If cholesterol cannot be managed through diet alone, you may also need to take medicines. ? Take any prescribed medicines  to control your cholesterol as told by your health care provider.  Manage your diabetes. ? Eating a healthy diet and exercising regularly are important parts of managing your blood sugar. If your blood sugar cannot be managed through diet and exercise, you may need to take medicines. ? Take any prescribed medicines to control  your diabetes as told by your health care provider.  Control your hypertension. ? To reduce your risk of stroke, try to keep your blood pressure below 130/80. ? Eating a healthy diet and exercising regularly are an important part of controlling your blood pressure. If your blood pressure cannot be managed through diet and exercise, you may need to take medicines. ? Take any prescribed medicines to control hypertension as told by your health care provider. ? Ask your health care provider if you should monitor your blood pressure at home. ? Have your blood pressure checked every year, even if your blood pressure is normal. Blood pressure increases with age and some medical conditions.  Get evaluated for sleep disorders (sleep apnea). Talk to your health care provider about getting a sleep evaluation if you snore a lot or have excessive sleepiness.  Take over-the-counter and prescription medicines only as told by your health care provider. Aspirin or blood thinners (antiplatelets or anticoagulants) may be recommended to reduce your risk of forming blood clots that can lead to stroke.  Make sure that any other medical conditions you have, such as atrial fibrillation or atherosclerosis, are managed. What are the warning signs of a stroke? The warning signs of a stroke can be easily remembered as BEFAST.  B is for balance. Signs include: ? Dizziness. ? Loss of balance or coordination. ? Sudden trouble walking.  E is for eyes. Signs include: ? A sudden change in vision. ? Trouble seeing.  F is for face. Signs include: ? Sudden weakness or numbness of the face. ? The face or eyelid drooping to one side.  A is for arms. Signs include: ? Sudden weakness or numbness of the arm, usually on one side of the body.  S is for speech. Signs include: ? Trouble speaking (aphasia). ? Trouble understanding.  T is for time. ? These symptoms may represent a serious problem that is an emergency. Do not  wait to see if the symptoms will go away. Get medical help right away. Call your local emergency services (911 in the [place] Do not drive yourself to the hospital.  Other signs of stroke may include: ? A sudden, severe headache with no known cause. ? Nausea or vomiting. ? Seizure. Where to find more information For more information, visit:  American Stroke Association: www.strokeassociation.org  National Stroke Association: www.stroke.org Summary  You can prevent a stroke by eating healthy, exercising, not smoking, limiting alcohol intake, and managing any medical conditions you may have.  Do not use any products that contain nicotine or tobacco, such as cigarettes and e-cigarettes. If you need help quitting, ask your health care provider. It may also be helpful to avoid exposure to secondhand smoke.  Remember BEFAST for warning signs of stroke. Get help right away if you or a loved one has any of these signs. This information is not intended to replace advice given to you by your health care provider. Make sure you discuss any questions you have with your health care provider. Document Released: 04/14/2004 Document Revised: 04/12/2016 Document Reviewed: 04/12/2016 Elsevier Interactive Patient Education  2019 [employer].

## 2018-05-29 NOTE — Progress Notes (Signed)
[facility] [address] [place]. [place] [address] [phone]       OFFICE CONSULT NOTE  [name] Date of Birth:  [birth year] Medical Record Number:  [id number]   Referring MD: Fayrene Helper Reason for Referral: Stroke  HPI: [name] is a 73 year old pleasant Caucasian lady seen today for initial office consultation visit for stroke.  History is obtained from the patient and review of hospital electronic medical records.  I personally reviewed imaging films in PACS.  She is a 73 year old lady with past medical history of hypothyroidism, gastroesophageal reflux disease and vitamin D deficiency who presented on 04/21/2018 for evaluation.  She woke up in the morning at 8 AM and felt fine soon after she went to the bathroom she described a funny feeling and noticed numbness on the left side of her cheek face and arm.  Left arm felt a little heavy.  She denies any slurred speech, facial droop, extremity weakness gait or balance problems.  Upon arrival in the emergency room her blood pressure was found to be elevated with systolics greater than 295.  Code stroke was activated but because of minimum sensory symptoms TPA was not given.  She was admitted for evaluation.  CT angiogram of the brain and neck both did not reveal any large vessel stenosis or occlusion.  MRI scan of the brain showed a small right lateral thalamic lacunar infarct.  LDL cholesterol was 155 mg percent and hemoglobin A1c was 6.0.  Transthoracic echo showed normal ejection fraction.  Urine drug screen was negative.  Patient's numbness recovered within 24 hours and by the next day she was back to her baseline.  She was started on aspirin and Plavix as well as Crestor 40 mg.  She states she is done well since discharge.  Her primary care physician has cut back the dose of Crestor to 20 mg and he plans to repeat lipid profile next week.  Patient states her blood pressure is usually runs in the 120s at  home though today it is elevated in office at 140/69.  She is tolerating aspirin Plavix well without bruising or bleeding.  She has no complaints today.  She has no prior history of strokes TIAs seizures or significant neurological problems.  She does not smoke or drink alcohol.  ROS:   14 system review of systems is positive for ringing in the ears only and all other systems negative PMH:  Past Medical History:  Diagnosis Date  . GERD (gastroesophageal reflux disease)   . Hypothyroidism (acquired)   . Stroke ([place])   . Vitamin D deficiency     Social History:  Social History   Socioeconomic History  . Marital status: Married    Spouse name: Not on file  . Number of children: Not on file  . Years of education: Not on file  . Highest education level: Not on file  Occupational History  . Occupation: retired  Scientific laboratory technician  . Financial resource strain: Not on file  . Food insecurity:    Worry: Not on file    Inability: Not on file  . Transportation needs:    Medical: Not on file    Non-medical: Not on file  Tobacco Use  . Smoking status: Never Smoker  . Smokeless tobacco: Never Used  Substance and Sexual Activity  . Alcohol use: No  . Drug use: No  . Sexual activity: Not on file  Lifestyle  . Physical activity:    Days per  week: Not on file    Minutes per session: Not on file  . Stress: Not on file  Relationships  . Social connections:    Talks on phone: Not on file    Gets together: Not on file    Attends religious service: Not on file    Active member of club or organization: Not on file    Attends meetings of clubs or organizations: Not on file    Relationship status: Not on file  . Intimate partner violence:    Fear of current or ex partner: Not on file    Emotionally abused: Not on file    Physically abused: Not on file    Forced sexual activity: Not on file  Other Topics Concern  . Not on file  Social History Narrative  . Not on file    Medications:     Current Outpatient Medications on File Prior to Visit  Medication Sig Dispense Refill  . cetirizine (ZYRTEC) 5 MG tablet Take 5 mg by mouth daily.    Marland Kitchen co-enzyme Q-10 30 MG capsule Take 210 mg by mouth daily.    Marland Kitchen levothyroxine (SYNTHROID, LEVOTHROID) 112 MCG tablet Take 110 mcg by mouth daily before breakfast.     . Magnesium 250 MG TABS Take 250 mg by mouth daily.    . Multiple Vitamins-Minerals (MULTIVITAMIN WITH MINERALS) tablet Take 1 tablet by mouth daily.    . Omega-3 Fatty Acids (FISH OIL) 500 MG CAPS Take by mouth.    Marland Kitchen omeprazole (PRILOSEC) 20 MG capsule Take 20 mg by mouth daily.    . rosuvastatin (CRESTOR) 40 MG tablet Take 1 tablet (40 mg total) by mouth daily at 6 PM for 30 days. (Patient taking differently: Take 20 mg by mouth daily at 6 PM. ) 30 tablet 0  . Vitamin D, Ergocalciferol, (DRISDOL) 1.25 MG (50000 UT) CAPS capsule Take 50,000 Units by mouth every 7 (seven) days. Pt takes on Friday of each week     No current facility-administered medications on file prior to visit.     Allergies:   Allergies  Allergen Reactions  . Erythromycin     "stomach on fire"  . Cephalosporins Rash  . Lipitor [Atorvastatin] Other (See Comments)    Joint acheness    Physical Exam General: Pleasant elderly Caucasian lady seated, in no evident distress Head: head normocephalic and atraumatic.   Neck: supple with no carotid or supraclavicular bruits Cardiovascular: regular rate and rhythm, no murmurs Musculoskeletal: no deformity Skin:  no rash/petichiae Vascular:  Normal pulses all extremities  Neurologic Exam Mental Status: Awake and fully alert. Oriented to place and time. Recent and remote memory intact. Attention span, concentration and fund of knowledge appropriate. Mood and affect appropriate.  Cranial Nerves: Fundoscopic exam reveals sharp disc margins. Pupils equal, briskly reactive to light. Extraocular movements full without nystagmus. Visual fields full to confrontation.  Hearing intact. Facial sensation intact. Face, tongue, palate moves normally and symmetrically.  Motor: Normal bulk and tone. Normal strength in all tested extremity muscles. Sensory.: intact to touch , pinprick , position and vibratory sensation.  Coordination: Rapid alternating movements normal in all extremities. Finger-to-nose and heel-to-shin performed accurately bilaterally. Gait and Station: Arises from chair without difficulty. Stance is normal. Gait demonstrates normal stride length and balance . Able to heel, toe and tandem walk with right  difficulty.  Reflexes: 1+ and symmetric. Toes downgoing.   NIHSS  0 Modified Rankin  0  ASSESSMENT: 73 year old Caucasian lady with right  thalamic lacunar infarct in February 2020 from small vessel disease.  Vascular risk factors of hyperlipidemia and age only     PLAN: I had a long d/w patient about his recent lacunar stroke, risk for recurrent stroke/TIAs, personally independently reviewed imaging studies and stroke evaluation results and answered questions.Continue Plavix 75 mg daily alone and stop aspirin for secondary stroke prevention and maintain strict control of hypertension with blood pressure goal below 130/90, diabetes with hemoglobin A1c goal below 6.5% and lipids with LDL cholesterol goal below 70 mg/dL. I also advised the patient to eat a healthy diet with plenty of whole grains, cereals, fruits and vegetables, exercise regularly and maintain ideal body weight.  I recommend she increase the dose of CoQ10 to 200 mg daily to reduce statin myalgias and have follow-up lipid profile checked in 1 weeks with her primary physician.  If her cholesterol is suboptimally controlled or she is unable to tolerate Crestor may need to consider the new PCSK9 inhibitor injections in the future.  Greater than 50% time during this 45-minute consultation visit was spent on counseling and coordination of care about her recent lacunar stroke and discussion about  stroke prevention and treatment and answering questions.  Followup in the future with my nurse practitioner Janett Billow in 6 months or call earlier if necessary  Antony Contras, MD  [facility] [address] [place], [place] [address]  Phone [phone] Fax [phone] Note: This document was prepared with digital dictation and possible smart phrase technology. Any transcriptional errors that result from this process are unintentional.

## 2018-05-31 ENCOUNTER — Other Ambulatory Visit: Payer: Self-pay

## 2018-05-31 ENCOUNTER — Ambulatory Visit
Admission: RE | Admit: 2018-05-31 | Discharge: 2018-05-31 | Disposition: A | Discharge status: Home / Self Care | Payer: Medicare Other | Source: Ambulatory Visit | Attending: Internal Medicine | Admitting: Internal Medicine

## 2018-05-31 DIAGNOSIS — Z1231 Encounter for screening mammogram for malignant neoplasm of breast: Secondary | ICD-10-CM

## 2018-06-01 ENCOUNTER — Other Ambulatory Visit: Payer: Self-pay | Admitting: Internal Medicine

## 2018-06-01 DIAGNOSIS — R928 Other abnormal and inconclusive findings on diagnostic imaging of breast: Secondary | ICD-10-CM

## 2018-06-04 DIAGNOSIS — I6529 Occlusion and stenosis of unspecified carotid artery: Secondary | ICD-10-CM | POA: Diagnosis not present

## 2018-06-05 ENCOUNTER — Other Ambulatory Visit: Payer: Self-pay | Admitting: Internal Medicine

## 2018-06-05 ENCOUNTER — Ambulatory Visit
Admission: RE | Admit: 2018-06-05 | Discharge: 2018-06-05 | Disposition: A | Discharge status: Home / Self Care | Payer: Medicare Other | Source: Ambulatory Visit | Attending: Internal Medicine | Admitting: Internal Medicine

## 2018-06-05 ENCOUNTER — Other Ambulatory Visit: Payer: Self-pay

## 2018-06-05 DIAGNOSIS — R928 Other abnormal and inconclusive findings on diagnostic imaging of breast: Secondary | ICD-10-CM

## 2018-06-05 DIAGNOSIS — N63 Unspecified lump in unspecified breast: Secondary | ICD-10-CM

## 2018-06-05 DIAGNOSIS — N6489 Other specified disorders of breast: Secondary | ICD-10-CM | POA: Diagnosis not present

## 2018-06-06 ENCOUNTER — Ambulatory Visit
Admission: RE | Admit: 2018-06-06 | Discharge: 2018-06-06 | Disposition: A | Discharge status: Home / Self Care | Payer: Medicare Other | Source: Ambulatory Visit | Attending: Internal Medicine | Admitting: Internal Medicine

## 2018-06-06 DIAGNOSIS — C50811 Malignant neoplasm of overlapping sites of right female breast: Secondary | ICD-10-CM | POA: Diagnosis not present

## 2018-06-06 DIAGNOSIS — N63 Unspecified lump in unspecified breast: Secondary | ICD-10-CM

## 2018-06-06 DIAGNOSIS — N6315 Unspecified lump in the right breast, overlapping quadrants: Secondary | ICD-10-CM | POA: Diagnosis not present

## 2018-06-07 DIAGNOSIS — C50919 Malignant neoplasm of unspecified site of unspecified female breast: Secondary | ICD-10-CM | POA: Diagnosis not present

## 2018-06-07 DIAGNOSIS — R7309 Other abnormal glucose: Secondary | ICD-10-CM | POA: Diagnosis not present

## 2018-06-07 DIAGNOSIS — E039 Hypothyroidism, unspecified: Secondary | ICD-10-CM | POA: Diagnosis not present

## 2018-06-07 DIAGNOSIS — Z8673 Personal history of transient ischemic attack (TIA), and cerebral infarction without residual deficits: Secondary | ICD-10-CM | POA: Diagnosis not present

## 2018-06-11 ENCOUNTER — Other Ambulatory Visit: Payer: Self-pay | Admitting: *Deleted

## 2018-06-11 ENCOUNTER — Encounter: Payer: Self-pay | Admitting: *Deleted

## 2018-06-11 ENCOUNTER — Telehealth: Payer: Self-pay | Admitting: Hematology and Oncology

## 2018-06-11 DIAGNOSIS — C50411 Malignant neoplasm of upper-outer quadrant of right female breast: Secondary | ICD-10-CM

## 2018-06-11 DIAGNOSIS — Z170 Estrogen receptor positive status [ER+]: Principal | ICD-10-CM

## 2018-06-11 NOTE — Telephone Encounter (Signed)
Spoke to patient to confirm afternoon [facility] appointment for 3/25, email and mail sent to patient

## 2018-06-12 ENCOUNTER — Other Ambulatory Visit: Payer: Self-pay | Admitting: *Deleted

## 2018-06-12 NOTE — Progress Notes (Signed)
[place] CONSULT NOTE  Patient Care Team: Deland Pretty, MD as PCP - General (Internal Medicine) Mauro Kaufmann, RN as Oncology Nurse Navigator Rockwell Germany, RN as Oncology Nurse Navigator  CHIEF COMPLAINTS/PURPOSE OF CONSULTATION: Newly diagnosed breast cancer  HISTORY OF PRESENTING ILLNESS:  ADAJA Powell 73 y.o. 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 The cancer was detected on a routine screening mammogram on 05/31/18 and was not palpable prior to diagnosis. A diagnostic mammogram on 06/05/18 showed a 72m mass in the right breast and no axillary adenopathy. A biopsy from 06/06/18 showed grade 3 invasive ductal carcinoma, HER2 negative, ER 100%, PR 95%, Ki67 80%. An ECHO from 04/22/18 showed an ejection fraction in the range of 60-65%.   She presents to the clinic alone today. She denies a family history of breast cancer, but has an extensive family history of pancreatic and other cancers. She had genetic testing done by her gynecologist last year that was negative for BRCA1 and BRCA2 mutations. In 1996 she had ovarian cancer and uterine cancer for which she underwent chemotherapy and a hyste[facility] with [facility]   I reviewed her records extensively and collaborated the history with the patient.  SUMMARY OF ONCOLOGIC HISTORY:   Malignant neoplasm of upper-outer quadrant of right breast in female, estrogen receptor positive ([place]   06/06/2018 Initial Diagnosis    Screening detected right breast mass at 9 o'clock position: 6 mm by ultrasound, axilla negative, grade 3 IDC, ER 100%, PR 95%, Ki-67 80%, HER-2 1+ negative, T1BN0 stage Ia clinical stage    06/13/2018 Cancer Staging    Staging form: Breast, AJCC 8th Edition - Clinical: Stage IA (cT1b, cN0, cM0, G3, ER+, PR+, HER2-) - Signed by GNicholas Lose MD on 06/13/2018    MEDICAL HISTORY:  Past Medical  History:  Diagnosis Date  . GERD (gastroesophageal reflux disease)   . Hypothyroidism (acquired)   . Stroke ([place]   . Vitamin D deficiency     SURGICAL HISTORY: Past Surgical History:  Procedure Laterality Date  . HYSTERECTOMY ABDOMINAL WITH SALPINGO-OOPHORECTOMY     remote ovarian cancer  . TONSILLECTOMY      SOCIAL HISTORY: Social History   Socioeconomic History  . Marital status: Married    Spouse name: Not on file  . Number of children: Not on file  . Years of education: Not on file  . Highest education level: Not on file  Occupational History  . Occupation: retired  SScientific laboratory technician . Financial resource strain: Not on file  . Food insecurity:    Worry: Not on file    Inability: Not on file  . Transportation needs:    Medical: Not on file    Non-medical: Not on file  Tobacco Use  . Smoking status: Never Smoker  . Smokeless tobacco: Never Used  Substance and Sexual Activity  . Alcohol use: Yes  . Drug use: No  . Sexual activity: Not on file  Lifestyle  . Physical activity:    Days per week: Not on file    Minutes per session: Not on file  . Stress: Not on file  Relationships  . Social connections:    Talks on phone: Not on file    Gets together: Not on file    Attends religious service: Not on file    Active member of club or organization: Not on file  Attends meetings of clubs or organizations: Not on file    Relationship status: Not on file  . Intimate partner violence:    Fear of current or ex partner: Not on file    Emotionally abused: Not on file    Physically abused: Not on file    Forced sexual activity: Not on file  Other Topics Concern  . Not on file  Social History Narrative  . Not on file    FAMILY HISTORY: Family History  Problem Relation Age of Onset  . Stroke Mother   . Lung cancer Mother   . Stroke Maternal Grandfather   . Colon cancer Father   . Prostate cancer Father   . Lung cancer Father   . Breast cancer Sister   . Lung  cancer Brother   . Lung cancer Maternal Aunt   . Pancreatic cancer Paternal Uncle     ALLERGIES:  is allergic to 5-alpha reductase inhibitors; erythromycin; keflex [cephalexin]; cephalosporins; and lipitor [atorvastatin].  MEDICATIONS:  Current Outpatient Medications  Medication Sig Dispense Refill  . clopidogrel (PLAVIX) 75 MG tablet Take 1 tablet (75 mg total) by mouth daily. 30 tablet 11  . co-enzyme Q-10 30 MG capsule Take 200 mg by mouth daily.     Marland Kitchen levothyroxine (SYNTHROID, LEVOTHROID) 112 MCG tablet Take 100 mcg by mouth daily before breakfast.     . Omega-3 Fatty Acids (FISH OIL) 500 MG CAPS Take 750 mg by mouth.     Marland Kitchen omeprazole (PRILOSEC) 20 MG capsule Take 20 mg by mouth daily.    . pantoprazole (PROTONIX) 40 MG tablet Take 40 mg by mouth daily. Delayed release    . Vitamin D, Ergocalciferol, (DRISDOL) 1.25 MG (50000 UT) CAPS capsule Take 50,000 Units by mouth every 7 (seven) days. Pt takes on Friday of each week    . cetirizine (ZYRTEC) 5 MG tablet Take 5 mg by mouth daily.    . rosuvastatin (CRESTOR) 40 MG tablet Take 1 tablet (40 mg total) by mouth daily at 6 PM for 30 days. (Patient taking differently: Take 20 mg by mouth daily at 6 PM. ) 30 tablet 0   No current facility-administered medications for this visit.     REVIEW OF SYSTEMS:   Constitutional: Denies fevers, chills or abnormal night sweats Eyes: Denies blurriness of vision, double vision or watery eyes Ears, nose, mouth, throat, and face: Denies mucositis or sore throat Respiratory: Denies cough, dyspnea or wheezes Cardiovascular: Denies palpitation, chest discomfort or lower extremity swelling Gastrointestinal:  Denies nausea, heartburn or change in bowel habits Skin: Denies abnormal skin rashes Lymphatics: Denies new lymphadenopathy or easy bruising Neurological:Denies numbness, tingling or new weaknesses Behavioral/Psych: Mood is stable, no new changes  Breast: Denies any palpable lumps or discharge All  other systems were reviewed with the patient and are negative.  PHYSICAL EXAMINATION: ECOG PERFORMANCE STATUS: 1 - Symptomatic but completely ambulatory  Vitals:   06/13/18 1230  BP: (!) 126/93  Pulse: (!) 180  Resp: 18  Temp: 98.2 F (36.8 C)  SpO2: 99%   Filed Weights   06/13/18 1230  Weight: 211 lb 3.2 oz (95.8 kg)    GENERAL:alert, no distress and comfortable SKIN: skin color, texture, turgor are normal, no rashes or significant lesions EYES: normal, conjunctiva are pink and non-injected, sclera clear OROPHARYNX:no exudate, no erythema and lips, buccal mucosa, and tongue normal  NECK: supple, thyroid normal size, non-tender, without nodularity LYMPH:  no palpable lymphadenopathy in the cervical, axillary or inguinal LUNGS:  clear to auscultation and percussion with normal breathing effort HEART: regular rate & rhythm and no murmurs and no lower extremity edema ABDOMEN:abdomen soft, non-tender and normal bowel sounds Musculoskeletal:no cyanosis of digits and no clubbing  PSYCH: alert & oriented x 3 with fluent speech NEURO: no focal motor/sensory deficits  LABORATORY DATA:  I have reviewed the data as listed Lab Results  Component Value Date   WBC 6.4 06/13/2018   HGB 13.2 06/13/2018   HCT 40.1 06/13/2018   MCV 88.7 06/13/2018   PLT 177 06/13/2018   Lab Results  Component Value Date   NA 142 06/13/2018   K 4.4 06/13/2018   CL 104 06/13/2018   CO2 27 06/13/2018    RADIOGRAPHIC STUDIES: I have personally reviewed the radiological reports and agreed with the findings in the report.  ASSESSMENT AND PLAN:  Malignant neoplasm of upper-outer quadrant of right breast in female, estrogen receptor positive ([place]) 06/06/2018:Screening detected right breast mass at 9 o'clock position: 6 mm by ultrasound, axilla negative, grade 3 IDC, ER 100%, PR 95%, Ki-67 80%, HER-2 1+ negative, T1BN0 stage Ia clinical stage  Pathology and radiology counseling:Discussed with the patient,  the details of pathology including the type of breast cancer,the clinical staging, the significance of ER, PR and HER-2/neu receptors and the implications for treatment. After reviewing the pathology in detail, we proceeded to discuss the different treatment options between surgery, radiation, chemotherapy, antiestrogen therapies.  Recommendations: 1. Breast conserving surgery followed by 2. Oncotype DX testing to determine if chemotherapy would be of any benefit followed by 3. Adjuvant radiation therapy followed by 4. Adjuvant antiestrogen therapy  Oncotype counseling: I discussed Oncotype DX test. I explained to the patient that this is a 21 gene panel to evaluate patient tumors DNA to calculate recurrence score. This would help determine whether patient has high risk or intermediate risk or low risk breast cancer. She understands that if her tumor was found to be high risk, she would benefit from systemic chemotherapy. If low risk, no need of chemotherapy. If she was found to be intermediate risk, we would need to evaluate the score as well as other risk factors and determine if an abbreviated chemotherapy may be of benefit.  Return to clinic after surgery to discuss final pathology report and then determine if Oncotype DX testing will need to be sent.  All questions were answered. The patient knows to call the clinic with any problems, questions or concerns.   Nicholas Lose, MD 06/13/2018  I, Cloyde Reams Dorshimer, am acting as scribe for Nicholas Lose, MD.  I have reviewed the above documentation for accuracy and completeness, and I agree with the above.

## 2018-06-13 ENCOUNTER — Other Ambulatory Visit: Payer: Self-pay

## 2018-06-13 ENCOUNTER — Ambulatory Visit: Payer: Self-pay | Admitting: General Surgery

## 2018-06-13 ENCOUNTER — Ambulatory Visit: Payer: Medicare Other | Attending: Radiation Oncology | Admitting: Radiation Oncology

## 2018-06-13 ENCOUNTER — Inpatient Hospital Stay: Payer: Medicare Other

## 2018-06-13 ENCOUNTER — Inpatient Hospital Stay: Payer: Medicare Other | Attending: Hematology and Oncology | Admitting: Hematology and Oncology

## 2018-06-13 ENCOUNTER — Encounter: Payer: Self-pay | Admitting: Hematology and Oncology

## 2018-06-13 DIAGNOSIS — C50911 Malignant neoplasm of unspecified site of right female breast: Secondary | ICD-10-CM

## 2018-06-13 DIAGNOSIS — Z90721 Acquired absence of ovaries, unilateral: Secondary | ICD-10-CM

## 2018-06-13 DIAGNOSIS — Z90710 Acquired absence of both cervix and uterus: Secondary | ICD-10-CM | POA: Insufficient documentation

## 2018-06-13 DIAGNOSIS — Z8042 Family history of malignant neoplasm of prostate: Secondary | ICD-10-CM | POA: Diagnosis not present

## 2018-06-13 DIAGNOSIS — Z800 Family history of malignant neoplasm of digestive organs: Secondary | ICD-10-CM | POA: Diagnosis not present

## 2018-06-13 DIAGNOSIS — C50411 Malignant neoplasm of upper-outer quadrant of right female breast: Secondary | ICD-10-CM | POA: Insufficient documentation

## 2018-06-13 DIAGNOSIS — Z170 Estrogen receptor positive status [ER+]: Secondary | ICD-10-CM

## 2018-06-13 DIAGNOSIS — Z803 Family history of malignant neoplasm of breast: Secondary | ICD-10-CM | POA: Diagnosis not present

## 2018-06-13 DIAGNOSIS — Z8542 Personal history of malignant neoplasm of other parts of uterus: Secondary | ICD-10-CM

## 2018-06-13 DIAGNOSIS — Z8543 Personal history of malignant neoplasm of ovary: Secondary | ICD-10-CM | POA: Diagnosis not present

## 2018-06-13 DIAGNOSIS — Z801 Family history of malignant neoplasm of trachea, bronchus and lung: Secondary | ICD-10-CM

## 2018-06-13 LAB — CBC WITH DIFFERENTIAL (CANCER CENTER ONLY)
Abs Immature Granulocytes: 0 10*3/uL (ref 0.00–0.07)
Basophils Absolute: 0.1 10*3/uL (ref 0.0–0.1)
Basophils Relative: 1 %
Eosinophils Absolute: 0.1 10*3/uL (ref 0.0–0.5)
Eosinophils Relative: 2 %
HCT: 40.1 % (ref 36.0–46.0)
Hemoglobin: 13.2 g/dL (ref 12.0–15.0)
Immature Granulocytes: 0 %
Lymphocytes Relative: 32 %
Lymphs Abs: 2.1 10*3/uL (ref 0.7–4.0)
MCH: 29.2 pg (ref 26.0–34.0)
MCHC: 32.9 g/dL (ref 30.0–36.0)
MCV: 88.7 fL (ref 80.0–100.0)
Monocytes Absolute: 0.5 10*3/uL (ref 0.1–1.0)
Monocytes Relative: 9 %
Neutro Abs: 3.6 10*3/uL (ref 1.7–7.7)
Neutrophils Relative %: 56 %
Platelet Count: 177 10*3/uL (ref 150–400)
RBC: 4.52 MIL/uL (ref 3.87–5.11)
RDW: 13.1 % (ref 11.5–15.5)
WBC Count: 6.4 10*3/uL (ref 4.0–10.5)
nRBC: 0 % (ref 0.0–0.2)

## 2018-06-13 LAB — CMP (CANCER CENTER ONLY)
ALT: 30 U/L (ref 0–44)
AST: 28 U/L (ref 15–41)
Albumin: 3.9 g/dL (ref 3.5–5.0)
Alkaline Phosphatase: 115 U/L (ref 38–126)
Anion gap: 11 (ref 5–15)
BUN: 17 mg/dL (ref 8–23)
CO2: 27 mmol/L (ref 22–32)
Calcium: 9.6 mg/dL (ref 8.9–10.3)
Chloride: 104 mmol/L (ref 98–111)
Creatinine: 1.01 mg/dL — ABNORMAL HIGH (ref 0.44–1.00)
GFR, Est AFR Am: >60 mL/min (ref >60)
GFR, Estimated: 56 mL/min — ABNORMAL LOW (ref >60)
Glucose, Bld: 100 mg/dL — ABNORMAL HIGH (ref 70–99)
Potassium: 4.4 mmol/L (ref 3.5–5.1)
Sodium: 142 mmol/L (ref 135–145)
Total Bilirubin: 0.5 mg/dL (ref 0.3–1.2)
Total Protein: 8.1 g/dL (ref 6.5–8.1)

## 2018-06-13 NOTE — Assessment & Plan Note (Signed)
06/06/2018:Screening detected right breast mass at 9 o'clock position: 6 mm by ultrasound, axilla negative, grade 3 IDC, ER 100%, PR 95%, Ki-67 80%, HER-2 1+ negative, T1BN0 stage Ia clinical stage  Pathology and radiology counseling:Discussed with the patient, the details of pathology including the type of breast cancer,the clinical staging, the significance of ER, PR and HER-2/neu receptors and the implications for treatment. After reviewing the pathology in detail, we proceeded to discuss the different treatment options between surgery, radiation, chemotherapy, antiestrogen therapies.  Recommendations: 1. Breast conserving surgery followed by 2. Oncotype DX testing to determine if chemotherapy would be of any benefit followed by 3. Adjuvant radiation therapy followed by 4. Adjuvant antiestrogen therapy  Oncotype counseling: I discussed Oncotype DX test. I explained to the patient that this is a 21 gene panel to evaluate patient tumors DNA to calculate recurrence score. This would help determine whether patient has high risk or intermediate risk or low risk breast cancer. She understands that if her tumor was found to be high risk, she would benefit from systemic chemotherapy. If low risk, no need of chemotherapy. If she was found to be intermediate risk, we would need to evaluate the score as well as other risk factors and determine if an abbreviated chemotherapy may be of benefit.  Return to clinic after surgery to discuss final pathology report and then determine if Oncotype DX testing will need to be sent.

## 2018-06-15 ENCOUNTER — Other Ambulatory Visit: Payer: Self-pay

## 2018-06-15 ENCOUNTER — Encounter (HOSPITAL_BASED_OUTPATIENT_CLINIC_OR_DEPARTMENT_OTHER): Payer: Self-pay | Admitting: *Deleted

## 2018-06-15 ENCOUNTER — Other Ambulatory Visit: Payer: Self-pay | Admitting: General Surgery

## 2018-06-15 DIAGNOSIS — C50911 Malignant neoplasm of unspecified site of right female breast: Secondary | ICD-10-CM

## 2018-06-18 ENCOUNTER — Telehealth: Payer: Self-pay

## 2018-06-18 ENCOUNTER — Telehealth: Payer: Self-pay | Admitting: *Deleted

## 2018-06-18 NOTE — Telephone Encounter (Signed)
Pt has called back asking for confirmation on if she has been given clearance on coming off of her clopidogrel (PLAVIX) 75 MG tablet in preporation for her upcoming procedure.  Phone rep reached out to RN Katrina and was told to inform pt that she can call the surgeron office tomorrow.  This message was relayed to pt and she had no further questions.  No call back requested.

## 2018-06-18 NOTE — Telephone Encounter (Signed)
I called [name] RMA at [phone]. I stated a clearance form and medical form fax was fax to our office. I stated pt had stroke in Feb 2020. Mardene Celeste stated the surgery was urgent because pt has breast cancer. They are only doing elective surgeries. Message will be sent to provider DR.SEthi about clearance.Pt surgery is June 25, 2018.

## 2018-06-18 NOTE — Telephone Encounter (Signed)
Spoke to pt concerning [place] from 3.25.20. Denies questions or concern regarding dx or treatment care plan. Encourage pt to call with needs. Received verbal understanding.

## 2018-06-18 NOTE — Telephone Encounter (Signed)
I fax the surgeon clearance letter twice to Attention Mardene Celeste at [phone] and [phone]. Fax was confirmed to both numbers as confirmed.

## 2018-06-18 NOTE — Telephone Encounter (Signed)
Due to nature of procedure, she is cleared to undergo procedure on 06/25/2018.  She can discontinue Plavix 3 to 5 days prior to procedure and recommend to restart immediately after once stable.  As she did have stroke in 04/2018, she is at slightly increased risk of recurrent stroke while off antithrombotic but this risk is acceptable due to nature procedure.

## 2018-06-18 NOTE — Telephone Encounter (Signed)
I called pts husband cell phone to get in contact with her. I stated both of her cell number and home number was stating pt could not be reach. I stated the clearance was fax this afternoon to [facility]. Pt stated she is having the surgery on 06/25/2018. I stated there was a range of 3 to 5 days to stop Plavix. I stated her Md who is performing the surgeon will make the decision when to stop it and start if back when safe. Pt was concern because she had a stroke in Feb 2020. I stated typically Dr. Mickel Duhamel NP recommend pts to wait 6 months before any surgeries or procedures. I stated this was a urgent request per her surgeon office. Pt verbalized understanding, and will call [facility] [place] [facility] tomorrow.

## 2018-06-18 NOTE — Telephone Encounter (Signed)
I called pts cell phone and listed number. Her cell phone state pt could not be reach at this time, I could not leave a vm. I called her home number, and had a busy signal I called both numbers three times and could not leave vm.

## 2018-06-18 NOTE — Telephone Encounter (Signed)
I call [name] RMA at [phone]. I stated to her clearance form was fax to [phone] and [phone]. Both were confirmed. She stated the scheduler will reach out to her about the plavix recommendations per our office.

## 2018-06-18 NOTE — Telephone Encounter (Signed)
Agree with plan as stated.  

## 2018-06-20 ENCOUNTER — Telehealth: Payer: Self-pay

## 2018-06-20 NOTE — Telephone Encounter (Signed)
Nutrition  RD working remotely.  Chart reviewed.  73 year old female with new diagnosis of breast cancer.  Nutrition packet given to patient on 3/25 at [facility].    Called patient today for follow-up.  No questions at this time.  Patient has contact information.  Tashon Capp B. Zenia Resides, Agency, [place] Registered Dietitian [phone] (pager)

## 2018-06-22 ENCOUNTER — Ambulatory Visit
Admission: RE | Admit: 2018-06-22 | Discharge: 2018-06-22 | Disposition: A | Discharge status: Home / Self Care | Payer: Medicare Other | Source: Ambulatory Visit | Attending: General Surgery | Admitting: General Surgery

## 2018-06-22 ENCOUNTER — Other Ambulatory Visit: Payer: Self-pay

## 2018-06-22 ENCOUNTER — Encounter: Payer: Self-pay | Admitting: General Practice

## 2018-06-22 DIAGNOSIS — C50911 Malignant neoplasm of unspecified site of right female breast: Secondary | ICD-10-CM

## 2018-06-22 NOTE — Progress Notes (Signed)
Patient given surgical soap with instructions to use 1/2 the night before surgery and 1/2 the DOS.  Patient given ENSURE - presurgery drink with instructions to drink at 0730am on DOS.  Patient verbalized understanding of instructions.

## 2018-06-22 NOTE — Progress Notes (Signed)
[place] Spiritual Care Note  Reached Ms Hinson by phone as [place] f/u. She was overall upbeat, citing gratitude for waking up in the morning, perspective and tools from past cancer dx, good support from family and friends, and prayer. Per pt, she will be relieved to be on the other side of surgery with more clarity about the path forward. Pt aware of ongoing [place] team/programming resources and states no other needs at this time.   [place], [place], [facility] Pager [phone] Voicemail [phone]

## 2018-06-24 NOTE — H&P (Signed)
75 menopausal female referred by Dr. Lovey Newcomer for evaluation of recently diagnosed carcinoma of the right breast. She recently presented for a screening mamogram revealing a possible right breast mass. Subsequent imaging included diagnostic mamogram showing a small irregular mass in the outer aspect of the right breast and ultrasound showing a 5 x 6 x 4 mm irregular hypoechoic mass at the 9:00 position 10 cm from the nipple. An ultrasound guided breast biopsy was performed on 06/06/2018 with pathology revealing invasive ductal carcinoma of the breast. She is seen now in breast multidisciplinary clinic for initial treatment planning. She has experienced no breast symptoms, specifically lump or pain or nipple discharge. She does not have a personal history of any previous breast problems. she does have a history of ovarian cancer treated with TAH/BSO and chemotherapy in the late 1990s with no evidence of recurrence and has subsequently had negative genetic testing. she is a retired Therapist, sports having worked for many years at [employer] at that time were the following: Tumor size: 0.6 cm Tumor grade: 3, Ki-67 6780% Estrogen Receptor: 100% positive Progesterone Receptor: 95% positive Her-2 neu: negative Lymph node status: negative   Past Surgical History Tawni Pummel, RN; 06/13/2018 8:26 AM) Breast Biopsy  Right. Hysterectomy (due to cancer) - Complete  Oral Surgery  Tonsillectomy   Diagnostic Studies History Tawni Pummel, RN; 06/13/2018 8:26 AM) Colonoscopy  1-5 years ago Mammogram  within last year Pap Smear  1-5 years ago  Medication History Tawni Pummel, RN; 06/13/2018 8:26 AM) Medications Reconciled  Social History Tawni Pummel, RN; 06/13/2018 8:26 AM) Alcohol use  Occasional alcohol use. Caffeine use  Carbonated beverages, Coffee. No drug use  Tobacco use  Never smoker.  Family History Tawni Pummel, RN; 06/13/2018 8:26 AM) Alcohol Abuse  Brother,  Sister. Anesthetic complications  Mother. Arthritis  Father, Mother. Breast Cancer  Family Members In General, Sister. Cerebrovascular Accident  Mother. Colon Cancer  Father. Depression  Daughter, Family Members In General. Diabetes Mellitus  Daughter, Father. Heart Disease  Mother. Hypertension  Father, Mother. Malignant Neoplasm Of Pancreas  Family Members In General. Migraine Headache  Daughter. Prostate Cancer  Father. Rectal Cancer  Father. Respiratory Condition  Father, Mother, Sister. Thyroid problems  Daughter.  Pregnancy / Birth History Tawni Pummel, RN; 06/13/2018 8:26 AM) Age at menarche  36 years. Age of menopause  61-55 Gravida  2 Irregular periods  Maternal age  60-30 Para  2  Other Problems Tawni Pummel, RN; 06/13/2018 8:26 AM) Cerebrovascular Accident  Gastroesophageal Reflux Disease  Hypercholesterolemia  Lump In Breast  Ovarian Cancer  Thyroid Disease  Umbilical Hernia Repair    Review of Systems Sunday Spillers Ledford RN; 06/13/2018 8:26 AM) General Not Present- Appetite Loss, Chills, Fatigue, Fever, Night Sweats, Weight Gain and Weight Loss. Skin Not Present- Change in Wart/Mole, Dryness, Hives, Jaundice, New Lesions, Non-Healing Wounds, Rash and Ulcer. HEENT Present- Hearing Loss, Ringing in the Ears, Seasonal Allergies and Wears glasses/contact lenses. Not Present- Earache, Hoarseness, Nose Bleed, Oral Ulcers, Sinus Pain, Sore Throat, Visual Disturbances and Yellow Eyes. Respiratory Not Present- Bloody sputum, Chronic Cough, Difficulty Breathing, Snoring and Wheezing. Breast Present- Breast Mass. Not Present- Breast Pain, Nipple Discharge and Skin Changes. Cardiovascular Not Present- Chest Pain, Difficulty Breathing Lying Down, Leg Cramps, Palpitations, Rapid Heart Rate, Shortness of Breath and Swelling of Extremities. Gastrointestinal Present- Constipation. Not Present- Abdominal Pain, Bloating, Bloody Stool, Change in Bowel  Habits, Chronic diarrhea, Difficulty Swallowing, Excessive gas, Gets full quickly at meals, Hemorrhoids, Indigestion,  Nausea, Rectal Pain and Vomiting. Female Genitourinary Not Present- Frequency, Nocturia, Painful Urination, Pelvic Pain and Urgency. Musculoskeletal Present- Joint Stiffness. Not Present- Back Pain, Joint Pain, Muscle Pain, Muscle Weakness and Swelling of Extremities. Neurological Not Present- Decreased Memory, Fainting, Headaches, Numbness, Seizures, Tingling, Tremor, Trouble walking and Weakness. Psychiatric Not Present- Anxiety, Bipolar, Change in Sleep Pattern, Depression, Fearful and Frequent crying. Endocrine Not Present- Cold Intolerance, Excessive Hunger, Hair Changes, Heat Intolerance, Hot flashes and New Diabetes. Hematology Present- Blood Thinners. Not Present- Easy Bruising, Excessive bleeding, Gland problems, HIV and Persistent Infections.   Physical Exam Marland Kitchen T. Hoxworth MD; 06/13/2018 3:12 PM) The physical exam findings are as follows: Note:General: Alert, moderately obese older Caucasian female, in no distress Skin: Warm and dry without rash or infection. HEENT: No palpable masses or thyromegaly. Sclera nonicteric. Pupils equal round and reactive. Lymph nodes: No cervical, supraclavicular, nodes palpable. Breasts: D cup bilaterally. No palpable masses in either breast. No skin changes or nipple inversion or crusting. No palpable axillary adenopathy. Lungs: Breath sounds clear and equal. No wheezing or increased work of breathing. Cardiovascular: Regular rate and rhythm without murmer. No JVD or edema. Abdomen: Nondistended. Soft and nontender. No masses palpable. No organomegaly. moderate sized slightly tender partially reducible incisional hernia around the umbilicus. Extremities: No edema or joint swelling or deformity. No chronic venous stasis changes. Neurologic: Alert and fully oriented. Gait normal. No focal weakness. Psychiatric: Normal mood and  affect. Thought content appropriate with normal judgement and insight   Assessment & Plan  MALIGNANT NEOPLASM OF RIGHT BREAST, STAGE 1, ESTROGEN RECEPTOR POSITIVE (C50.911) Impression: 73 year old female with a new diagnosis of cancer of the right breast, uupper outer quadrant. Clinical stage 1A, ER positive, PR positive, HER-2 negative. I discussed with the patient today initial surgical treatment options. We discussed options of breast conservation with lumpectomy or total mastectomy and sentinal lymph node biopsy/dissection. she would be a good candidate for breast conservation with lumpectomy and axillary sentinel lymph node biopsy which is what she would prefer. We discussed the indications and nature of the procedure, and expected recovery, in detail. Surgical risks including anesthetic complications, cardiorespiratory complications, bleeding, infection, wound healing complications, blood clots, lymphedema, local and distant recurrence and possible need for further surgery based on the final pathology was discussed and understood. Chemotherapy, hormonal therapy and radiation therapy have been discussed. They have been provided with literature regarding the treatment of breast cancer. All questions were answered. They understand and agree to proceed and we will go ahead with scheduling. she is on Plavix which we will stop 5 days preoperatively.

## 2018-06-25 ENCOUNTER — Ambulatory Visit
Admission: RE | Admit: 2018-06-25 | Discharge: 2018-06-25 | Disposition: A | Discharge status: Home / Self Care | Payer: Medicare Other | Source: Ambulatory Visit | Attending: General Surgery | Admitting: General Surgery

## 2018-06-25 ENCOUNTER — Other Ambulatory Visit: Payer: Self-pay

## 2018-06-25 ENCOUNTER — Ambulatory Visit (HOSPITAL_BASED_OUTPATIENT_CLINIC_OR_DEPARTMENT_OTHER): Payer: Medicare Other | Admitting: Anesthesiology

## 2018-06-25 ENCOUNTER — Encounter: Payer: Self-pay | Admitting: General Practice

## 2018-06-25 ENCOUNTER — Encounter (HOSPITAL_BASED_OUTPATIENT_CLINIC_OR_DEPARTMENT_OTHER): Payer: Self-pay | Admitting: *Deleted

## 2018-06-25 ENCOUNTER — Ambulatory Visit (HOSPITAL_COMMUNITY)
Admission: RE | Admit: 2018-06-25 | Discharge: 2018-06-25 | Disposition: A | Discharge status: Home / Self Care | Payer: Medicare Other | Source: Ambulatory Visit | Attending: General Surgery | Admitting: General Surgery

## 2018-06-25 ENCOUNTER — Ambulatory Visit (HOSPITAL_BASED_OUTPATIENT_CLINIC_OR_DEPARTMENT_OTHER)
Admission: RE | Admit: 2018-06-25 | Discharge: 2018-06-25 | Disposition: A | Discharge status: Home / Self Care | Payer: Medicare Other | Attending: General Surgery | Admitting: General Surgery

## 2018-06-25 ENCOUNTER — Encounter (HOSPITAL_BASED_OUTPATIENT_CLINIC_OR_DEPARTMENT_OTHER)
Admission: RE | Disposition: A | Discharge status: Home / Self Care | Payer: Self-pay | Source: Home / Self Care | Attending: General Surgery

## 2018-06-25 DIAGNOSIS — Z8673 Personal history of transient ischemic attack (TIA), and cerebral infarction without residual deficits: Secondary | ICD-10-CM | POA: Diagnosis not present

## 2018-06-25 DIAGNOSIS — Z8543 Personal history of malignant neoplasm of ovary: Secondary | ICD-10-CM | POA: Diagnosis not present

## 2018-06-25 DIAGNOSIS — C50411 Malignant neoplasm of upper-outer quadrant of right female breast: Secondary | ICD-10-CM | POA: Insufficient documentation

## 2018-06-25 DIAGNOSIS — C50911 Malignant neoplasm of unspecified site of right female breast: Secondary | ICD-10-CM | POA: Diagnosis not present

## 2018-06-25 DIAGNOSIS — G8918 Other acute postprocedural pain: Secondary | ICD-10-CM | POA: Diagnosis not present

## 2018-06-25 DIAGNOSIS — Z170 Estrogen receptor positive status [ER+]: Secondary | ICD-10-CM | POA: Insufficient documentation

## 2018-06-25 HISTORY — DX: Prediabetes: R73.03

## 2018-06-25 HISTORY — PX: BREAST LUMPECTOMY WITH RADIOACTIVE SEED AND SENTINEL LYMPH NODE BIOPSY: SHX6550

## 2018-06-25 HISTORY — PX: BREAST LUMPECTOMY: SHX2

## 2018-06-25 HISTORY — DX: Malignant (primary) neoplasm, unspecified: C80.1

## 2018-06-25 SURGERY — BREAST LUMPECTOMY WITH RADIOACTIVE SEED AND SENTINEL LYMPH NODE BIOPSY
Anesthesia: General | Site: Breast | Laterality: Right

## 2018-06-25 MED ORDER — GABAPENTIN 300 MG PO CAPS
300.0000 mg | ORAL_CAPSULE | ORAL | Status: AC
  Administered 2018-06-25: 300 mg via ORAL

## 2018-06-25 MED ORDER — FENTANYL CITRATE (PF) 100 MCG/2ML IJ SOLN
INTRAMUSCULAR | Status: AC
  Filled 2018-06-25: qty 2

## 2018-06-25 MED ORDER — MIDAZOLAM HCL 2 MG/2ML IJ SOLN
1.0000 mg | INTRAMUSCULAR | Status: DC | PRN
  Administered 2018-06-25: 1 mg via INTRAVENOUS

## 2018-06-25 MED ORDER — CIPROFLOXACIN IN D5W 400 MG/200ML IV SOLN
INTRAVENOUS | Status: AC
  Filled 2018-06-25: qty 200

## 2018-06-25 MED ORDER — CHLORHEXIDINE GLUCONATE CLOTH 2 % EX PADS: 6.0000 | MEDICATED_PAD | Freq: Once | CUTANEOUS | Status: DC

## 2018-06-25 MED ORDER — DEXAMETHASONE SODIUM PHOSPHATE 10 MG/ML IJ SOLN
INTRAMUSCULAR | Status: AC
  Filled 2018-06-25: qty 1

## 2018-06-25 MED ORDER — ACETAMINOPHEN 500 MG PO TABS
1000.0000 mg | ORAL_TABLET | ORAL | Status: AC
  Administered 2018-06-25: 1000 mg via ORAL

## 2018-06-25 MED ORDER — SCOPOLAMINE 1 MG/3DAYS TD PT72: 1.0000 | MEDICATED_PATCH | Freq: Once | TRANSDERMAL | Status: DC | PRN

## 2018-06-25 MED ORDER — ROPIVACAINE HCL 5 MG/ML IJ SOLN
INTRAMUSCULAR | Status: DC | PRN
  Administered 2018-06-25: 30 mL via PERINEURAL

## 2018-06-25 MED ORDER — ONDANSETRON HCL 4 MG/2ML IJ SOLN
INTRAMUSCULAR | Status: DC | PRN
  Administered 2018-06-25: 4 mg via INTRAVENOUS

## 2018-06-25 MED ORDER — EPHEDRINE 5 MG/ML INJ
INTRAVENOUS | Status: AC
  Filled 2018-06-25: qty 10

## 2018-06-25 MED ORDER — FENTANYL CITRATE (PF) 100 MCG/2ML IJ SOLN
50.0000 ug | INTRAMUSCULAR | Status: DC | PRN
  Administered 2018-06-25: 50 ug via INTRAVENOUS

## 2018-06-25 MED ORDER — LIDOCAINE 2% (20 MG/ML) 5 ML SYRINGE
INTRAMUSCULAR | Status: DC | PRN
  Administered 2018-06-25: 60 mg via INTRAVENOUS

## 2018-06-25 MED ORDER — PROPOFOL 10 MG/ML IV BOLUS
INTRAVENOUS | Status: DC | PRN
  Administered 2018-06-25: 150 mg via INTRAVENOUS

## 2018-06-25 MED ORDER — MIDAZOLAM HCL 2 MG/2ML IJ SOLN
INTRAMUSCULAR | Status: AC
  Filled 2018-06-25: qty 2

## 2018-06-25 MED ORDER — PROPOFOL 10 MG/ML IV BOLUS
INTRAVENOUS | Status: AC
  Filled 2018-06-25: qty 20

## 2018-06-25 MED ORDER — OXYCODONE HCL 5 MG PO TABS
ORAL_TABLET | ORAL | Status: AC
  Filled 2018-06-25: qty 1

## 2018-06-25 MED ORDER — FENTANYL CITRATE (PF) 100 MCG/2ML IJ SOLN
INTRAMUSCULAR | Status: DC | PRN
  Administered 2018-06-25 (×4): 25 ug via INTRAVENOUS

## 2018-06-25 MED ORDER — PROMETHAZINE HCL 25 MG/ML IJ SOLN: 6.2500 mg | INTRAMUSCULAR | Status: DC | PRN

## 2018-06-25 MED ORDER — ONDANSETRON HCL 4 MG/2ML IJ SOLN
INTRAMUSCULAR | Status: AC
  Filled 2018-06-25: qty 2

## 2018-06-25 MED ORDER — HYDROMORPHONE HCL 1 MG/ML IJ SOLN: 0.2500 mg | INTRAMUSCULAR | Status: DC | PRN

## 2018-06-25 MED ORDER — ACETAMINOPHEN 500 MG PO TABS
ORAL_TABLET | ORAL | Status: AC
  Filled 2018-06-25: qty 2

## 2018-06-25 MED ORDER — BUPIVACAINE HCL (PF) 0.25 % IJ SOLN
INTRAMUSCULAR | Status: DC | PRN
  Administered 2018-06-25: 5 mL

## 2018-06-25 MED ORDER — EPHEDRINE SULFATE 50 MG/ML IJ SOLN
INTRAMUSCULAR | Status: DC | PRN
  Administered 2018-06-25: 10 mg via INTRAVENOUS

## 2018-06-25 MED ORDER — LIDOCAINE 2% (20 MG/ML) 5 ML SYRINGE
INTRAMUSCULAR | Status: AC
  Filled 2018-06-25: qty 5

## 2018-06-25 MED ORDER — KETOROLAC TROMETHAMINE 30 MG/ML IJ SOLN
INTRAMUSCULAR | Status: AC
  Filled 2018-06-25: qty 1

## 2018-06-25 MED ORDER — LACTATED RINGERS IV SOLN
INTRAVENOUS | Status: DC
  Administered 2018-06-25 (×2): via INTRAVENOUS

## 2018-06-25 MED ORDER — DEXAMETHASONE SODIUM PHOSPHATE 4 MG/ML IJ SOLN
INTRAMUSCULAR | Status: DC | PRN
  Administered 2018-06-25: 10 mg via INTRAVENOUS

## 2018-06-25 MED ORDER — CIPROFLOXACIN IN D5W 400 MG/200ML IV SOLN
400.0000 mg | INTRAVENOUS | Status: AC
  Administered 2018-06-25: 400 mg via INTRAVENOUS

## 2018-06-25 MED ORDER — OXYCODONE HCL 5 MG PO TABS
5.0000 mg | ORAL_TABLET | Freq: Once | ORAL | Status: AC | PRN
  Administered 2018-06-25: 5 mg via ORAL

## 2018-06-25 MED ORDER — TECHNETIUM TC 99M SULFUR COLLOID FILTERED
1.0000 | Freq: Once | INTRAVENOUS | Status: AC | PRN
  Administered 2018-06-25: 1 via INTRADERMAL

## 2018-06-25 MED ORDER — OXYCODONE HCL 5 MG PO TABS: 5.0000 mg | ORAL_TABLET | Freq: Four times a day (QID) | ORAL | 0 refills | Status: DC | PRN

## 2018-06-25 MED ORDER — GABAPENTIN 300 MG PO CAPS
ORAL_CAPSULE | ORAL | Status: AC
  Filled 2018-06-25: qty 1

## 2018-06-25 MED ORDER — OXYCODONE HCL 5 MG/5ML PO SOLN: 5.0000 mg | Freq: Once | ORAL | Status: AC | PRN

## 2018-06-25 SURGICAL SUPPLY — 63 items
ADH SKN CLS APL DERMABOND .7 (GAUZE/BANDAGES/DRESSINGS) ×1
APL PRP STRL LF DISP 70% ISPRP (MISCELLANEOUS) ×1
APPLIER CLIP 9.375 MED OPEN (MISCELLANEOUS)
APR CLP MED 9.3 20 MLT OPN (MISCELLANEOUS)
BINDER BREAST LRG (GAUZE/BANDAGES/DRESSINGS) IMPLANT
BINDER BREAST MEDIUM (GAUZE/BANDAGES/DRESSINGS) IMPLANT
BINDER BREAST XLRG (GAUZE/BANDAGES/DRESSINGS) IMPLANT
BINDER BREAST XXLRG (GAUZE/BANDAGES/DRESSINGS) ×2 IMPLANT
BLADE SURG 15 STRL LF DISP TIS (BLADE) ×1 IMPLANT
BLADE SURG 15 STRL SS (BLADE) ×3
CANISTER SUC SOCK COL 7IN (MISCELLANEOUS) IMPLANT
CANISTER SUCT 1200ML W/VALVE (MISCELLANEOUS) IMPLANT
CHLORAPREP W/TINT 26 (MISCELLANEOUS) ×3 IMPLANT
CLIP APPLIE 9.375 MED OPEN (MISCELLANEOUS) IMPLANT
CLIP VESOCCLUDE SM WIDE 6/CT (CLIP) ×3 IMPLANT
CLOSURE WOUND 1/2 X4 (GAUZE/BANDAGES/DRESSINGS) ×1
COVER BACK TABLE REUSABLE LG (DRAPES) ×3 IMPLANT
COVER MAYO STAND REUSABLE (DRAPES) ×3 IMPLANT
COVER PROBE W GEL 5X96 (DRAPES) ×3 IMPLANT
COVER WAND RF STERILE (DRAPES) IMPLANT
DECANTER SPIKE VIAL GLASS SM (MISCELLANEOUS) IMPLANT
DERMABOND ADVANCED (GAUZE/BANDAGES/DRESSINGS) ×2
DERMABOND ADVANCED .7 DNX12 (GAUZE/BANDAGES/DRESSINGS) ×1 IMPLANT
DRAPE LAPAROSCOPIC ABDOMINAL (DRAPES) ×3 IMPLANT
DRAPE UTILITY XL STRL (DRAPES) ×3 IMPLANT
ELECT COATED BLADE 2.86 ST (ELECTRODE) ×3 IMPLANT
ELECT REM PT RETURN 9FT ADLT (ELECTROSURGICAL) ×3
ELECTRODE REM PT RTRN 9FT ADLT (ELECTROSURGICAL) ×1 IMPLANT
GLOVE BIO SURGEON STRL SZ7 (GLOVE) ×6 IMPLANT
GLOVE BIOGEL PI IND STRL 7.5 (GLOVE) ×1 IMPLANT
GLOVE BIOGEL PI INDICATOR 7.5 (GLOVE) ×2
GOWN STRL REUS W/ TWL LRG LVL3 (GOWN DISPOSABLE) ×2 IMPLANT
GOWN STRL REUS W/TWL LRG LVL3 (GOWN DISPOSABLE) ×6
HEMOSTAT ARISTA ABSORB 3G PWDR (HEMOSTASIS) ×2 IMPLANT
ILLUMINATOR WAVEGUIDE N/F (MISCELLANEOUS) IMPLANT
KIT MARKER MARGIN INK (KITS) ×3 IMPLANT
LIGHT WAVEGUIDE WIDE FLAT (MISCELLANEOUS) IMPLANT
NDL HYPO 25X1 1.5 SAFETY (NEEDLE) ×1 IMPLANT
NDL SAFETY ECLIPSE 18X1.5 (NEEDLE) IMPLANT
NEEDLE HYPO 18GX1.5 SHARP (NEEDLE)
NEEDLE HYPO 25X1 1.5 SAFETY (NEEDLE) ×3 IMPLANT
NS IRRIG 1000ML POUR BTL (IV SOLUTION) IMPLANT
PACK BASIN DAY SURGERY FS (CUSTOM PROCEDURE TRAY) ×3 IMPLANT
PENCIL BUTTON HOLSTER BLD 10FT (ELECTRODE) ×3 IMPLANT
SLEEVE SCD COMPRESS KNEE MED (MISCELLANEOUS) ×3 IMPLANT
SPONGE LAP 4X18 RFD (DISPOSABLE) ×3 IMPLANT
STRIP CLOSURE SKIN 1/2X4 (GAUZE/BANDAGES/DRESSINGS) ×2 IMPLANT
SUT ETHILON 2 0 FS 18 (SUTURE) IMPLANT
SUT MNCRL AB 4-0 PS2 18 (SUTURE) ×3 IMPLANT
SUT MON AB 4-0 PC3 18 (SUTURE) ×2 IMPLANT
SUT MON AB 5-0 PS2 18 (SUTURE) IMPLANT
SUT SILK 2 0 SH (SUTURE) IMPLANT
SUT VIC AB 2-0 SH 27 (SUTURE) ×6
SUT VIC AB 2-0 SH 27XBRD (SUTURE) ×1 IMPLANT
SUT VIC AB 3-0 SH 27 (SUTURE) ×3
SUT VIC AB 3-0 SH 27X BRD (SUTURE) ×1 IMPLANT
SUT VIC AB 5-0 PS2 18 (SUTURE) IMPLANT
SYR CONTROL 10ML LL (SYRINGE) ×3 IMPLANT
TOWEL GREEN STERILE FF (TOWEL DISPOSABLE) ×3 IMPLANT
TRAY FAXITRON CT DISP (TRAY / TRAY PROCEDURE) ×3 IMPLANT
TUBE CONNECTING 20'X1/4 (TUBING)
TUBE CONNECTING 20X1/4 (TUBING) IMPLANT
YANKAUER SUCT BULB TIP NO VENT (SUCTIONS) IMPLANT

## 2018-06-25 NOTE — Anesthesia Procedure Notes (Signed)
Anesthesia Regional Block: Pectoralis block   Pre-Anesthetic Checklist: ,, timeout performed, Correct Patient, Correct Site, Correct Laterality, Correct Procedure, Correct Position, site marked, Risks and benefits discussed,  Surgical consent,  Pre-op evaluation,  At surgeon's request and post-op pain management  Laterality: Right  Prep: chloraprep       Needles:  Injection technique: Single-shot  Needle Type: Stimiplex     Needle Length: 9cm  Needle Gauge: 21     Additional Needles:   Procedures:,,,, ultrasound used (permanent image in chart),,,,  Narrative:  Start time: 06/25/2018 10:17 AM End time: 06/25/2018 10:22 AM Injection made incrementally with aspirations every 5 mL.  Performed by: Personally  Anesthesiologist: Lynda Rainwater, MD

## 2018-06-25 NOTE — Progress Notes (Signed)
[facility] Psychosocial Distress Screening Spiritual Care  Late entry of [place] distress screen due to COVID-19 precautionary procedures. Ms Mayers has already received f/u from Support Team.  [place] 06/25/2018  Screening Type Initial Screening  Distress experienced in past week (1-10) 3  Emotional problem type Nervousness/Anxiety  Information Concerns Type Lack of info about treatment  Referral to support programs Yes     [place], [place], [facility] Pager [phone] Voicemail [phone]

## 2018-06-25 NOTE — Progress Notes (Signed)
Assisted Dr. Miller with right, ultrasound guided, pectoralis block. Side rails up, monitors on throughout procedure. See vital signs in flow sheet. Tolerated Procedure well. 

## 2018-06-25 NOTE — Anesthesia Postprocedure Evaluation (Signed)
Anesthesia Post Note  Patient: Solenne Manwarren Usery  Procedure(s) Performed: RIGHT BREAST LUMPECTOMY WITH RADIOACTIVE SEED AND RIGHT AXILLARY SENTINEL LYMPH NODE BIOPSY (Right Breast)     Patient location during evaluation: PACU Anesthesia Type: General Level of consciousness: awake and alert Pain management: pain level controlled Vital Signs Assessment: post-procedure vital signs reviewed and stable Respiratory status: spontaneous breathing, nonlabored ventilation and respiratory function stable Cardiovascular status: blood pressure returned to baseline and stable Postop Assessment: no apparent nausea or vomiting Anesthetic complications: no    Last Vitals:  Vitals:   06/25/18 1230 06/25/18 1245  BP:  (!) 109/50  Pulse: (!) 58 (!) 59  Resp: 17 13  Temp:    SpO2: 100% 100%    Last Pain:  Vitals:   06/25/18 1230  TempSrc:   PainSc: [place]

## 2018-06-25 NOTE — Progress Notes (Signed)
Nuclear med performed by [facility]. Tolerated well, Emotional support provided thru out procedure

## 2018-06-25 NOTE — Op Note (Signed)
Preoperative diagnosis: Clinical stage I right breast cancer Postoperative diagnosis: Same as above Procedure: 1.  Right breast radioactive seed guided lumpectomy 2.  Right deep axillary sentinel lymph node biopsy Surgeon: Dr. Serita Grammes Anesthesia: General with pectoral block Estimated blood loss: 15 cc Specimens: 1.  Right breast tissue marked with paint 2.  Additional superior margin marked with paint 3.  Right axillary sentinel lymph nodes with highest count of 711 Complications: None Drains: None Sponge and needle count was correct at completion Disposition to recovery in stable condition  Indications: This is a 73 year old female patient of my partner Dr. Excell Seltzer  He is unable to do her surgery today so I discussed the patient with him.  She was noted to have a clinical stage I right breast cancer and had been scheduled for surgery already.  She had a radioactive seed placed.  I met her today and we discussed proceeding with a lumpectomy and axillary sentinel lymph node biopsy.  Procedure: After informed consent was obtained the patient first underwent a pectoral block.  She was injected with technetium in the standard periareolar fashion.  She was given antibiotics.  SCDs were in place.  She was placed under general anesthesia without complication.  She was prepped and draped in the standard sterile surgical fashion.  A surgical timeout was then performed.  I located the radioactive seed in the lower outer right breast.  This was fairly close to the skin.  I infiltrated some Marcaine and made a curvilinear incision overlying the tumor.  I then proceeded to remove the seed and the surrounding tissue with an attempt to get a clear margin.  This was passed off the table.  Mammogram confirmed removal of the seed and the clip.  On the 3D images it looks like I might be close at the superior margin so I removed a little bit more and sent this off the table.  Clips were placed.  I then  sutured this closed with 2-0 Vicryl, 3-0 Vicryl, and 4-0 Monocryl.  Steri-Strips and glue were placed.  I then located the sentinel nodes in the low axilla.  I infiltrated Marcaine and Mevacor made a curvilinear incision below the axillary hairline.  I dissected through the axillary fascia.  She had numerous sentinel lymph nodes that were present but none of these were very enlarged.  There was no background radioactivity.  Hemostasis was observed.  I did place some Arista in the cavity due to the fact that she is going to begin her Plavix again tonight.  I then closed this with 2-0 Vicryl, 3-0 Vicryl, and 4-0 Monocryl.  Steri-Strips and glue were placed.  She was extubated and transferred to recovery stable.

## 2018-06-25 NOTE — Anesthesia Procedure Notes (Signed)
Procedure Name: LMA Insertion Date/Time: 06/25/2018 11:07 AM Performed by: Justice Rocher, CRNA Pre-anesthesia Checklist: Timeout performed, Patient identified, Emergency Drugs available, Suction available and Patient being monitored Patient Re-evaluated:Patient Re-evaluated prior to induction Oxygen Delivery Method: Circle system utilized Preoxygenation: Pre-oxygenation with 100% oxygen Induction Type: IV induction Ventilation: Mask ventilation without difficulty LMA: LMA inserted LMA Size: 4.0 Number of attempts: 1

## 2018-06-25 NOTE — Anesthesia Preprocedure Evaluation (Signed)
Anesthesia Evaluation  Patient identified by MRN, date of birth, ID band Patient awake    Reviewed: Allergy & Precautions, NPO status , Patient's Chart, lab work & pertinent test results  Airway Mallampati: II  TM Distance: >3 FB Neck ROM: Full    Dental no notable dental hx.    Pulmonary neg pulmonary ROS,    Pulmonary exam normal breath sounds clear to auscultation       Cardiovascular negative cardio ROS Normal cardiovascular exam Rhythm:Regular Rate:Normal     Neuro/Psych CVA negative psych ROS   GI/Hepatic Neg liver ROS, GERD  ,  Endo/Other  Hypothyroidism   Renal/GU negative Renal ROS  negative genitourinary   Musculoskeletal negative musculoskeletal ROS (+)   Abdominal   Peds negative pediatric ROS (+)  Hematology negative hematology ROS (+)   Anesthesia Other Findings Breast Cancer  Reproductive/Obstetrics negative OB ROS                             Anesthesia Physical Anesthesia Plan  ASA: III  Anesthesia Plan: General   Post-op Pain Management: GA combined w/ Regional for post-op pain   Induction: Intravenous  PONV Risk Score and Plan: 3 and Ondansetron, Dexamethasone and Midazolam  Airway Management Planned: LMA  Additional Equipment:   Intra-op Plan:   Post-operative Plan: Extubation in OR  Informed Consent: I have reviewed the patients History and Physical, chart, labs and discussed the procedure including the risks, benefits and alternatives for the proposed anesthesia with the patient or authorized representative who has indicated his/her understanding and acceptance.     Dental advisory given  Plan Discussed with: CRNA  Anesthesia Plan Comments:         Anesthesia Quick Evaluation

## 2018-06-25 NOTE — Discharge Instructions (Signed)
[facility] Office Phone Number [phone]  BREAST BIOPSY/ PARTIAL MASTECTOMY: POST OP INSTRUCTIONS Take  650 mg tylenol every 6 hours for next 72 hours then as needed. Do ot take any Tylenol until after 4:00 pm today. Use ice several times daily also.Restart plavix tonight.  Always review your discharge instruction sheet given to you by the facility where your surgery was performed.  IF YOU HAVE DISABILITY OR FAMILY LEAVE FORMS, YOU MUST BRING THEM TO THE OFFICE FOR PROCESSING.  DO NOT GIVE THEM TO YOUR DOCTOR.  1. A prescription for pain medication may be given to you upon discharge.  Take your pain medication as prescribed, if needed. 2. Take your usually prescribed medications unless otherwise directed 3. If you need a refill on your pain medication, please contact your pharmacy.  They will contact our office to request authorization.  Prescriptions will not be filled after 5pm or on week-ends. 4. You should eat very light the first 24 hours after surgery, such as soup, crackers, pudding, etc.  Resume your normal diet the day after surgery. 5. Most patients will experience some swelling and bruising in the breast.  Ice packs and a good support bra will help.  Wear the breast binder provided or a sports bra for 72 hours day and night.  After that wear a sports bra during the day until you return to the office. Swelling and bruising can take several days to resolve.  6. It is common to experience some constipation if taking pain medication after surgery.  Increasing fluid intake and taking a stool softener will usually help or prevent this problem from occurring.  A mild laxative (Milk of Magnesia or Miralax) should be taken according to package directions if there are no bowel movements after 48 hours. 7. There is skin glue and steristrips on the incision, you may shower in 24 hours.  The glue and steristrips will flake off over the next 2-3 weeks. 8. ACTIVITIES:  You may resume  regular daily activities (gradually increasing) beginning the next day.  Wearing a good support bra or sports bra minimizes pain and swelling.  You may have sexual intercourse when it is comfortable. a. You may drive when you no longer are taking prescription pain medication, you can comfortably wear a seatbelt, and you can safely maneuver your car and apply brakes. b. RETURN TO WORK:  ______________________________________________________________________________________ 9. You should see your doctor in the office for a follow-up appointment approximately two weeks after your surgery.  Your doctors nurse will typically make your follow-up appointment when she calls you with your pathology report.  Expect your pathology report 3-4 business days after your surgery.  You may call to check if you do not hear from [place] after three days. 10. OTHER INSTRUCTIONS: _______________________________________________________________________________________________ _____________________________________________________________________________________________________________________________________ _____________________________________________________________________________________________________________________________________ _____________________________________________________________________________________________________________________________________  WHEN TO CALL DR WAKEFIELD: 1. Fever over 101.0 2. Nausea and/or vomiting. 3. Extreme swelling or bruising. 4. Continued bleeding from incision. 5. Increased pain, redness, or drainage from the incision.  The clinic staff is available to answer your questions during regular business hours.  Please dont hesitate to call and ask to speak to one of the nurses for clinical concerns.  If you have a medical emergency, go to the nearest emergency room or call 911.  A surgeon from [facility] is always on call at the hospital.  For further questions,  please visit [email or web address]      Post Anesthesia Home Care Instructions  Activity: Get plenty of rest  for the remainder of the day. A responsible individual must stay with you for 24 hours following the procedure.  For the next 24 hours, DO NOT: -Drive a car -Paediatric nurse -Drink alcoholic beverages -Take any medication unless instructed by your physician -Make any legal decisions or sign important papers.  Meals: Start with liquid foods such as gelatin or soup. Progress to regular foods as tolerated. Avoid greasy, spicy, heavy foods. If nausea and/or vomiting occur, drink only clear liquids until the nausea and/or vomiting subsides. Call your physician if vomiting continues.  Special Instructions/Symptoms: Your throat may feel dry or sore from the anesthesia or the breathing tube placed in your throat during surgery. If this causes discomfort, gargle with warm salt water. The discomfort should disappear within 24 hours.  If you had a scopolamine patch placed behind your ear for the management of post- operative nausea and/or vomiting:  1. The medication in the patch is effective for 72 hours, after which it should be removed.  Wrap patch in a tissue and discard in the trash. Wash hands thoroughly with soap and water. 2. You may remove the patch earlier than 72 hours if you experience unpleasant side effects which may include dry mouth, dizziness or visual disturbances. 3. Avoid touching the patch. Wash your hands with soap and water after contact with the patch.

## 2018-06-25 NOTE — Transfer of Care (Signed)
Immediate Anesthesia Transfer of Care Note  Patient: Alexandra Powell  Procedure(s) Performed: Procedure(s) (LRB): RIGHT BREAST LUMPECTOMY WITH RADIOACTIVE SEED AND RIGHT AXILLARY SENTINEL LYMPH NODE BIOPSY (Right)  Patient Location: PACU  Anesthesia Type: General  Level of Consciousness: awake, sedated, patient cooperative and responds to stimulation  Airway & Oxygen Therapy: Patient Spontanous Breathing and Patient connected to face mask oxygen  Post-op Assessment: Report given to PACU RN, Post -op Vital signs reviewed and stable and Patient moving all extremities  Post vital signs: Reviewed and stable  Complications: No apparent anesthesia complications

## 2018-06-25 NOTE — Interval H&P Note (Signed)
History and Physical Interval Note:  06/25/2018 10:40 AM  Alexandra Powell  has presented today for surgery, with the diagnosis of right breast cancer.  The various methods of treatment have been discussed with the patient and family. After consideration of risks, benefits and other options for treatment, the patient has consented to  Procedure(s): RIGHT BREAST LUMPECTOMY WITH RADIOACTIVE SEED AND RIGHT AXILLARY SENTINEL LYMPH NODE BIOPSY (Right) as a surgical intervention.  The patient's history has been reviewed, patient examined, no change in status, stable for surgery.  I have reviewed the patient's chart and labs.  Questions were answered to the patient's satisfaction.     Rolm Bookbinder

## 2018-06-26 ENCOUNTER — Encounter (HOSPITAL_BASED_OUTPATIENT_CLINIC_OR_DEPARTMENT_OTHER): Payer: Self-pay | Admitting: General Surgery

## 2018-06-27 ENCOUNTER — Telehealth: Payer: Self-pay | Admitting: *Deleted

## 2018-06-27 NOTE — Telephone Encounter (Signed)
Received order for oncoytpe testing. Requisition faxed to pathology. Received by Varney Biles

## 2018-07-06 ENCOUNTER — Telehealth: Payer: Self-pay | Admitting: *Deleted

## 2018-07-06 ENCOUNTER — Other Ambulatory Visit: Payer: Self-pay | Admitting: *Deleted

## 2018-07-06 DIAGNOSIS — C50411 Malignant neoplasm of upper-outer quadrant of right female breast: Secondary | ICD-10-CM

## 2018-07-06 DIAGNOSIS — Z170 Estrogen receptor positive status [ER+]: Principal | ICD-10-CM

## 2018-07-06 NOTE — Telephone Encounter (Signed)
Received oncotype score of 24/10%. Physician team notified. Called pt with results and discussed does not need chemotherapy based on these results. Informed next step is xrt. Received verbal understanding. Denies further needs or questions at this time.

## 2018-07-09 ENCOUNTER — Institutional Professional Consult (permissible substitution): Payer: Medicare Other | Admitting: Radiation Oncology

## 2018-07-09 ENCOUNTER — Ambulatory Visit: Payer: Medicare Other | Admitting: Radiation Oncology

## 2018-07-09 ENCOUNTER — Ambulatory Visit: Payer: Medicare Other

## 2018-07-16 NOTE — Progress Notes (Signed)
Location of Breast Cancer: Malignant neoplasm of upper-outer quadrant of right breast in female, estrogen receptor positive [facility])  Histology per Pathology Report: 06/25/18:  Diagnosis 1. 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 - THE SURGICAL RESECTION MARGINS ARE NEGATIVE FOR CARCINOMA.  Receptor Status: ER(100%), PR (95%), Her2-neu (negative), Ki-(80%)  Did patient present with symptoms (if so, please note symptoms) or was this found on screening mammography?: The cancer was detected on a routine screening mammogram on 05/31/18 and was not palpable prior to diagnosis. A diagnostic mammogram on 06/05/18 showed a 57m mass in the right breast and no axillary adenopathy. A biopsy from 06/06/18 showed grade 3 invasive ductal carcinoma, HER2 negative, ER 100%, PR 95%, Ki67 80%.  Past/Anticipated interventions by surgeon, if any: 06/25/18:Procedure: 1.  Right breast radioactive seed guided lumpectomy 2.  Right deep axillary sentinel lymph node biopsy Surgeon: Dr. MSerita Grammes   Past/Anticipated interventions by medical oncology, if any: Chemotherapy Per Dr. GLindi Adie3/25/20:  Recommendations: 1. Breast conserving surgery followed by 2. Oncotype DX testing to determine if chemotherapy would be of any benefit followed by 3. Adjuvant radiation therapy followed by 4. Adjuvant antiestrogen therapy  Lymphedema issues, if any:  Pt denies  Pain issues, if any: Pt denies  SAFETY ISSUES:  Prior radiation? No. Pt only had chemotherapy previously.  Pacemaker/ICD? No  Possible current pregnancy? No  Is the patient on methotrexate? No  Current Complaints / other details:  Pt presents today for initial consult with Dr. KSondra Come Pt is unaccompanied.     JLoma Sousa RN 07/18/2018,8:29 AM

## 2018-07-18 ENCOUNTER — Encounter: Payer: Self-pay | Admitting: Radiation Oncology

## 2018-07-18 ENCOUNTER — Ambulatory Visit
Admission: RE | Admit: 2018-07-18 | Discharge: 2018-07-18 | Disposition: A | Discharge status: Home / Self Care | Payer: Medicare Other | Source: Ambulatory Visit | Attending: Radiation Oncology | Admitting: Radiation Oncology

## 2018-07-18 ENCOUNTER — Other Ambulatory Visit: Payer: Self-pay

## 2018-07-18 VITALS — BP 126/83 | HR 61 | Temp 97.8°F | Resp 20 | Ht 61.5 in | Wt 206.2 lb

## 2018-07-18 DIAGNOSIS — E039 Hypothyroidism, unspecified: Secondary | ICD-10-CM | POA: Diagnosis not present

## 2018-07-18 DIAGNOSIS — Z9889 Other specified postprocedural states: Secondary | ICD-10-CM | POA: Diagnosis not present

## 2018-07-18 DIAGNOSIS — Z803 Family history of malignant neoplasm of breast: Secondary | ICD-10-CM | POA: Insufficient documentation

## 2018-07-18 DIAGNOSIS — Z8673 Personal history of transient ischemic attack (TIA), and cerebral infarction without residual deficits: Secondary | ICD-10-CM | POA: Diagnosis not present

## 2018-07-18 DIAGNOSIS — Z170 Estrogen receptor positive status [ER+]: Secondary | ICD-10-CM | POA: Insufficient documentation

## 2018-07-18 DIAGNOSIS — E559 Vitamin D deficiency, unspecified: Secondary | ICD-10-CM | POA: Insufficient documentation

## 2018-07-18 DIAGNOSIS — Z90710 Acquired absence of both cervix and uterus: Secondary | ICD-10-CM | POA: Insufficient documentation

## 2018-07-18 DIAGNOSIS — Z510 Encounter for antineoplastic radiation therapy: Secondary | ICD-10-CM | POA: Insufficient documentation

## 2018-07-18 DIAGNOSIS — C50411 Malignant neoplasm of upper-outer quadrant of right female breast: Secondary | ICD-10-CM

## 2018-07-18 DIAGNOSIS — Z800 Family history of malignant neoplasm of digestive organs: Secondary | ICD-10-CM | POA: Insufficient documentation

## 2018-07-18 DIAGNOSIS — Z90722 Acquired absence of ovaries, bilateral: Secondary | ICD-10-CM | POA: Insufficient documentation

## 2018-07-18 DIAGNOSIS — Z8542 Personal history of malignant neoplasm of other parts of uterus: Secondary | ICD-10-CM | POA: Insufficient documentation

## 2018-07-18 DIAGNOSIS — Z8042 Family history of malignant neoplasm of prostate: Secondary | ICD-10-CM | POA: Diagnosis not present

## 2018-07-18 DIAGNOSIS — K219 Gastro-esophageal reflux disease without esophagitis: Secondary | ICD-10-CM | POA: Insufficient documentation

## 2018-07-18 DIAGNOSIS — Z808 Family history of malignant neoplasm of other organs or systems: Secondary | ICD-10-CM | POA: Diagnosis not present

## 2018-07-18 DIAGNOSIS — Z8544 Personal history of malignant neoplasm of other female genital organs: Secondary | ICD-10-CM | POA: Diagnosis not present

## 2018-07-18 NOTE — Patient Instructions (Signed)
Coronavirus (COVID-19) Are you at risk?  Are you at risk for the Coronavirus (COVID-19)?  To be considered HIGH RISK for Coronavirus (COVID-19), you have to meet the following criteria:  . Traveled to [place], [place], [place], [place] or [place]; or in the [place] to [place], [place], [place], or [place]; and have fever, cough, and shortness of breath within the last 2 weeks of travel OR . Been in close contact with a person diagnosed with COVID-19 within the last 2 weeks and have fever, cough, and shortness of breath . IF YOU DO NOT MEET THESE CRITERIA, YOU ARE CONSIDERED LOW RISK FOR COVID-19.  What to do if you are HIGH RISK for COVID-19?  . If you are having a medical emergency, call 911. . Seek medical care right away. Before you go to a doctor's office, urgent care or emergency department, call ahead and tell them about your recent travel, contact with someone diagnosed with COVID-19, and your symptoms. You should receive instructions from your physician's office regarding next steps of care.  . When you arrive at healthcare provider, tell the healthcare staff immediately you have returned from visiting [place], [place], [place], [place] or [place]; or traveled in the [place] to [place], [place], [place], or [place]; in the last two weeks or you have been in close contact with a person diagnosed with COVID-19 in the last 2 weeks.   . Tell the health care staff about your symptoms: fever, cough and shortness of breath. . After you have been seen by a medical provider, you will be either: o Tested for (COVID-19) and discharged home on quarantine except to seek medical care if symptoms worsen, and asked to  - Stay home and avoid contact with others until you get your results (4-5 days)  - Avoid travel on public transportation if possible (such as bus, train, or airplane) or o Sent to the Emergency Department by EMS for evaluation, COVID-19 testing, and possible  admission depending on your condition and test results.  What to do if you are LOW RISK for COVID-19?  Reduce your risk of any infection by using the same precautions used for avoiding the common cold or flu:  . Wash your hands often with soap and warm water for at least 20 seconds.  If soap and water are not readily available, use an alcohol-based hand sanitizer with at least 60% alcohol.  . If coughing or sneezing, cover your mouth and nose by coughing or sneezing into the elbow areas of your shirt or coat, into a tissue or into your sleeve (not your hands). . Avoid shaking hands with others and consider head nods or verbal greetings only. . Avoid touching your eyes, nose, or mouth with unwashed hands.  . Avoid close contact with people who are sick. . Avoid places or events with large numbers of people in one location, like concerts or sporting events. . Carefully consider travel plans you have or are making. . If you are planning any travel outside or inside the [place], visit the CDC's Travelers' Health webpage for the latest health notices. . If you have some symptoms but not all symptoms, continue to monitor at home and seek medical attention if your symptoms worsen. . If you are having a medical emergency, call 911.   ADDITIONAL HEALTHCARE OPTIONS FOR PATIENTS  [facility] / e-Visit: [email or web address]         [facility]: [phone]  [facility]: [phone]                   [facility]: [phone]   

## 2018-07-18 NOTE — Progress Notes (Signed)
Radiation Oncology         (336) [phone] ________________________________  Initial Outpatient Consultation  Name: Alexandra Powell MRN: [id number]  Date: 07/18/2018  DOB: [birth year]  HA:LPFXT, Thayer Jew, MD  Nicholas Lose, MD   REFERRING PHYSICIAN: Nicholas Lose, MD  DIAGNOSIS: Malignant neoplasm of upper-outer quadrant of right breast in female, estrogen receptor positive   Stage 1A (pT1b, pN0), Right breast UOQ Invasive Ductal Carcinoma, ER+/PR+, Her2- Grade III  HISTORY OF PRESENT ILLNESS::Alexandra Powell is a 73 y.o. female who is presenting to the office today for evaluation of her breast cancer.  Of note, the patient went to the ED on February 1 for facial numbness, which Dr. Eulis Foster determined was caused by a CVA. She was admitted to the hospital for further management, evaluation and monitoring. While in the ED on 04/21/18, she had a head and neck CT w/wo contrast which did not show any extracranial or intracranial flow-limiting stenosis or dissection. She also had a brain MRI without contrast which revealed small acute lacunar infarct in the central right thalamus. No acute hemorrhage or mass effect. Underlying mild to moderate for age chronic small vessel disease was suspected, including occasional chronic micro-hemorrhages. She then underwent a head CT code stroke without contrast, which showed normal for age cerebral volume, no acute intracranial findings, but chronic left cerebellar hemispheric infarct. 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 Aspects was 10. She was discharged 04/22/18.   She is here today for breast cancer evaluation. Her stage 1a cancer was detected in the right breast on routine screening mammogram 05/31/18 at [facility]. Diagnostic mammogram with right breast ultrasonography confirmed a suspicious right breast mass (measuring 5 x 6 x 4 mm) in the 9 o'clock position. She underwent a right breast needle core biopsy on  06/06/18, and pathology revealed her mass to be invasive ductal carcinoma. Prognostic indicators significant for: ER, 100% positive and PR, 95% positive, both with strong staining intensity. Proliferation marker Ki67 at 80%. HER2 negative.  She proceeded with right lumpectomy and sentinel node biopsies under Dr. Donne Hazel on 06/25/18. Pathology from the procedure revealed invasive ductal carcinoma, grade III/III, spanning 0.9 cm. The surgical resection margins were negative for carcinoma. No evidence of carcinoma was found in 7/7 surveyed lymph nodes.  A breast excision of the right additional superior margin was performed and benign breast parenchyma was noted, no evidence of malignancy.  She consulted with Dr. Lindi Adie on 06/13/18 who noted she denies a family history of breast cancer, but has an extensive family history of pancreatic and other cancers. She had genetic testing done by her gynecologist last year that was negative for BRCA1 and BRCA2 mutations. In 1996 she had ovarian cancer and uterine cancer for which she underwent chemotherapy and a hysterectomy with salpingo-oophorectomy with Dr. Fermin Powell here at [facility].   Dr. Lindi Adie ordered oncotype testing, and her score was 24/10% and therefore she does not need chemotherapy.   she had no symptoms to report today.   PREVIOUS RADIATION THERAPY: No  PAST MEDICAL HISTORY:  has a past medical history of Cancer ([place]) (05/2018), GERD (gastroesophageal reflux disease), Hypothyroidism (acquired), Pre-diabetes, Stroke ([place]) (04/2018), and Vitamin D deficiency.    PAST SURGICAL HISTORY: Past Surgical History:  Procedure Laterality Date   ABDOMINAL HYSTERECTOMY     BREAST LUMPECTOMY WITH RADIOACTIVE SEED AND SENTINEL LYMPH NODE BIOPSY Right 06/25/2018   Procedure: RIGHT BREAST LUMPECTOMY WITH RADIOACTIVE SEED AND RIGHT AXILLARY SENTINEL LYMPH NODE BIOPSY;  Surgeon: Rolm Bookbinder, MD;  Location: [facility];  Service:  General;  Laterality: Right;   HYSTERECTOMY ABDOMINAL WITH SALPINGO-OOPHORECTOMY     remote ovarian cancer   TONSILLECTOMY      FAMILY HISTORY: family history includes Breast cancer in her sister; Colon cancer in her father; Lung cancer in her brother, father, maternal aunt, and mother; Pancreatic cancer in her paternal uncle; Prostate cancer in her father; Stroke in her maternal grandfather and mother.  SOCIAL HISTORY:  reports that she has never smoked. She has never used smokeless tobacco. She reports current alcohol use. She reports that she does not use drugs.  ALLERGIES: Erythromycin; Keflex [cephalexin]; Cephalosporins; and Lipitor [atorvastatin]  MEDICATIONS:  Current Outpatient Medications  Medication Sig Dispense Refill   cetirizine (ZYRTEC) 5 MG tablet Take 5 mg by mouth daily.     clopidogrel (PLAVIX) 75 MG tablet Take 1 tablet (75 mg total) by mouth daily. 30 tablet 11   co-enzyme Q-10 30 MG capsule Take 200 mg by mouth 2 (two) times daily.      levothyroxine (SYNTHROID, LEVOTHROID) 112 MCG tablet Take 88 mcg by mouth daily before breakfast.      magnesium oxide (MAG-OX) 400 MG tablet Take 400 mg by mouth daily.     Omega-3 Fatty Acids (FISH OIL) 500 MG CAPS Take 750 mg by mouth.      oxyCODONE (OXY IR/ROXICODONE) 5 MG immediate release tablet Take 1 tablet (5 mg total) by mouth every 6 (six) hours as needed for moderate pain, severe pain or breakthrough pain. 10 tablet 0   pantoprazole (PROTONIX) 40 MG tablet Take 40 mg by mouth daily. Delayed release     rosuvastatin (CRESTOR) 40 MG tablet Take 1 tablet (40 mg total) by mouth daily at 6 PM for 30 days. (Patient taking differently: Take 20 mg by mouth daily at 6 PM. ) 30 tablet 0   Vitamin D, Ergocalciferol, (DRISDOL) 1.25 MG (50000 UT) CAPS capsule Take 50,000 Units by mouth every 7 (seven) days. Pt takes on Friday of each week     No current facility-administered medications for this encounter.     REVIEW OF  SYSTEMS:  A 10+ POINT REVIEW OF SYSTEMS WAS OBTAINED including neurology, dermatology, psychiatry, cardiac, respiratory, lymph, extremities, GI, GU, musculoskeletal, constitutional, reproductive, HEENT. All pertinent positives are noted in the HPI. All others are negative.    PHYSICAL EXAM:  height is 5' 1.5" (1.562 m) and weight is 206 lb 3.2 oz (93.5 kg). Her oral temperature is 97.8 F (36.6 C). Her blood pressure is 126/83 and her pulse is 61. Her respiration is 20 and oxygen saturation is 99%.   General: Alert and oriented, in no acute distress HEENT: Head is normocephalic. Extraocular movements are intact. Oropharynx is clear. Neck: Neck is supple, no palpable cervical or supraclavicular lymphadenopathy. Heart: Regular in rate and rhythm with no murmurs, rubs, or gallops. Chest: Clear to auscultation bilaterally, with no rhonchi, wheezes, or rales. Abdomen: Soft, nontender, nondistended, with no rigidity or guarding. Extremities: No cyanosis or edema. Lymphatics: see Neck Exam Skin: No concerning lesions. Musculoskeletal: symmetric strength and muscle tone throughout. Neurologic: Cranial nerves II through XII are grossly intact. No obvious focalities. Speech is fluent. Coordination is intact. Psychiatric: Judgment and insight are intact. Affect is appropriate. Left breast with no palpable mass, nipple discharge, or bleeding. Right breast with a well-healing scar in the LOQ without signs of drainage or infection. No dominant mass, nipple discharge or bleeding. She has  a separate scar in the right axillary region from her SLN, also healing well.    ECOG = 1  0 - Asymptomatic (Fully active, able to carry on all predisease activities without restriction)  1 - Symptomatic but completely ambulatory (Restricted in physically strenuous activity but ambulatory and able to carry out work of a light or sedentary nature. For example, light housework, office work)  2 - Symptomatic, <50% in bed  during the day (Ambulatory and capable of all self care but unable to carry out any work activities. Up and about more than 50% of waking hours)  3 - Symptomatic, >50% in bed, but not bedbound (Capable of only limited self-care, confined to bed or chair 50% or more of waking hours)  4 - Bedbound (Completely disabled. Cannot carry on any self-care. Totally confined to bed or chair)  5 - Death   Eustace Pen MM, Creech RH, Tormey DC, et al. [phone]). "Toxicity and response criteria of the [facility]". [place] Oncol. 5 (6): 649-55  LABORATORY DATA:  Lab Results  Component Value Date   WBC 6.4 06/13/2018   HGB 13.2 06/13/2018   HCT 40.1 06/13/2018   MCV 88.7 06/13/2018   PLT 177 06/13/2018   NEUTROABS 3.6 06/13/2018   Lab Results  Component Value Date   NA 142 06/13/2018   K 4.4 06/13/2018   CL 104 06/13/2018   CO2 27 06/13/2018   GLUCOSE 100 (H) 06/13/2018   CREATININE 1.01 (H) 06/13/2018   CALCIUM 9.6 06/13/2018      RADIOGRAPHY: Nm Sentinel Node Inj-no Rpt (breast)  Result Date: 06/25/2018 Sulfur colloid was injected by the nuclear medicine technologist for melanoma sentinel node.   Mm Breast Surgical Specimen  Result Date: 06/25/2018 CLINICAL DATA:  RIGHT breast cancer status post lumpectomy after preoperative radioactive seed localization. EXAM: SPECIMEN RADIOGRAPH OF THE RIGHT BREAST COMPARISON:  Previous exam(s). FINDINGS: Status post excision of the right breast. The radioactive seed and biopsy marker clip are present, completely intact, and were marked for pathology. Findings discussed with the OR staff during the procedure. IMPRESSION: Specimen radiograph of the right breast. Electronically Signed   By: Franki Cabot M.D.   On: 06/25/2018 11:28   Mm Rt Radioactive Seed Loc Mammo Guide  Result Date: 06/22/2018 CLINICAL DATA:  Patient with RIGHT breast cancer scheduled for lumpectomy requiring preoperative radioactive seed localization. EXAM: MAMMOGRAPHIC  GUIDED RADIOACTIVE SEED LOCALIZATION OF THE RIGHT BREAST COMPARISON:  Previous exam(s). FINDINGS: Patient presents for radioactive seed localization prior to lumpectomy. I met with the patient and we discussed the procedure of seed localization including benefits and alternatives. We discussed the high likelihood of a successful procedure. We discussed the risks of the procedure including infection, bleeding, tissue injury and further surgery. We discussed the low dose of radioactivity involved in the procedure. Informed, written consent was given. The usual time-out protocol was performed immediately prior to the procedure. Using mammographic guidance, sterile technique, 1% lidocaine and an I-125 radioactive seed, ribbon shaped clip within the outer RIGHT breast was localized using a lateral approach. The follow-up mammogram images confirm the seed in the expected location and were marked for Dr. Excell Seltzer. Follow-up survey of the patient confirms presence of the radioactive seed. Order number of I-125 seed:  [id number]. Total activity:  8.466 millicurie reference Date: 05/29/2018 The patient tolerated the procedure well and was released from the [place]. She was given instructions regarding seed removal. IMPRESSION: Radioactive seed localization right breast. No apparent complications.  Electronically Signed   By: Franki Cabot M.D.   On: 06/22/2018 14:15      IMPRESSION: Stage 1A (pT1b, pN0), Right breast UOQ Invasive Ductal Carcinoma, ER+/PR+, Her2- Grade III. The pt would be a good candidate for breast conservation with radiation therapy directed at the right breast. Given the high grade nature of her malignancy I would not recommend delaying initiation of her treatment in light of the COVID-19 epidemic. The patient also does not wish to delay her therapy. She would appear to be a good candidate for hypofractionated radiation treatment.  Today, I talked to the patient  about the findings and work-up  thus far.  We discussed the natural history of her disease and general treatment, highlighting the role of radiotherapy in the management.  We discussed the available radiation techniques, and focused on the details of logistics and delivery.  We reviewed the anticipated acute and late sequelae associated with radiation in this setting.  The patient was encouraged to ask questions that I answered to the best of my ability.  A patient consent form was discussed and signed.  We retained a copy for our records.  The patient would like to proceed with radiation and will be scheduled for CT simulation.   PLAN:CT simulation later this morning. Treatments to begin next week. She would appear to be a good candidate for a hypofractionation over ~ 4 weeks.   ------------------------------------------------  Blair Promise, PhD, MD    This document serves as a record of services personally performed by Gery Pray, MD. It was created on his behalf by Mary-Margaret Loma Messing, a trained medical scribe. The creation of this record is based on the scribe's personal observations and the provider's statements to them. This document has been checked and approved by the attending provider.

## 2018-07-18 NOTE — Progress Notes (Signed)
  Radiation Oncology         [phone] ________________________________  Name: Alexandra Powell MRN: [id number]  Date: 07/18/2018  DOB: 1945/06/03  SIMULATION AND TREATMENT PLANNING NOTE    ICD-10-CM   1. Malignant neoplasm of upper-outer quadrant of right breast in female, estrogen receptor positive ([place]) C50.411    Z17.0     DIAGNOSIS:  Stage 1A (pT1b, pN0), Right breast UOQ Invasive Ductal Carcinoma, ER+/PR+, Her2- Grade III  NARRATIVE:  The patient was brought to the [facility].  Identity was confirmed.  All relevant records and images related to the planned course of therapy were reviewed.  The patient freely provided informed written consent to proceed with treatment after reviewing the details related to the planned course of therapy. The consent form was witnessed and verified by the simulation staff.  Then, the patient was set-up in a stable reproducible  supine position for radiation therapy.  CT images were obtained.  Surface markings were placed.  The CT images were loaded into the planning software.  Then the target and avoidance structures were contoured.  Treatment planning then occurred.  The radiation prescription was entered and confirmed.  Then, I designed and supervised the construction of a total of 3 medically necessary complex treatment devices.  I have requested : 3D Simulation  I have requested a DVH of the following structures: Heart, lungs, lumpectomy cavity.  I have ordered: Dose Calc.  PLAN:  The patient will receive 40.05 Gy in 15 fractions followed by a boost to the lumpectomy cavity of 10 Gy in 5 fractions for a cumulative dose to the lumpectomy cavity of 50.05 Gy  -----------------------------------  Blair Promise, PhD, MD  This document serves as a record of services personally performed by Gery Pray, MD. It was created on his behalf by Mary-Margaret Loma Messing, a trained medical scribe. The creation of this record is based on the  scribe's personal observations and the provider's statements to them. This document has been checked and approved by the attending provider.

## 2018-07-23 ENCOUNTER — Encounter: Payer: Self-pay | Admitting: *Deleted

## 2018-07-23 ENCOUNTER — Telehealth: Payer: Self-pay | Admitting: *Deleted

## 2018-07-23 NOTE — Telephone Encounter (Signed)
Called and gave the patient the appt for 6/3

## 2018-07-24 DIAGNOSIS — C50411 Malignant neoplasm of upper-outer quadrant of right female breast: Secondary | ICD-10-CM | POA: Diagnosis not present

## 2018-07-24 DIAGNOSIS — Z510 Encounter for antineoplastic radiation therapy: Secondary | ICD-10-CM | POA: Diagnosis not present

## 2018-07-24 DIAGNOSIS — Z170 Estrogen receptor positive status [ER+]: Secondary | ICD-10-CM | POA: Insufficient documentation

## 2018-07-26 ENCOUNTER — Ambulatory Visit
Admission: RE | Admit: 2018-07-26 | Discharge: 2018-07-26 | Disposition: A | Discharge status: Home / Self Care | Payer: Medicare Other | Source: Ambulatory Visit | Attending: Radiation Oncology | Admitting: Radiation Oncology

## 2018-07-26 ENCOUNTER — Other Ambulatory Visit: Payer: Self-pay

## 2018-07-26 DIAGNOSIS — Z170 Estrogen receptor positive status [ER+]: Principal | ICD-10-CM

## 2018-07-26 DIAGNOSIS — Z510 Encounter for antineoplastic radiation therapy: Secondary | ICD-10-CM | POA: Diagnosis not present

## 2018-07-26 DIAGNOSIS — C50411 Malignant neoplasm of upper-outer quadrant of right female breast: Secondary | ICD-10-CM

## 2018-07-26 NOTE — Progress Notes (Signed)
  Radiation Oncology         [phone] ________________________________  Name: Alexandra Powell MRN: [id number]  Date: 07/26/2018  DOB: 11-Apr-1945  Simulation Verification Note    ICD-10-CM   1. Malignant neoplasm of upper-outer quadrant of right breast in female, estrogen receptor positive ([place]) C50.411    Z17.0     Status: outpatient  NARRATIVE: The patient was brought to the treatment unit and placed in the planned treatment position. The clinical setup was verified. Then port films were obtained and uploaded to the radiation oncology medical record software.  The treatment beams were carefully compared against the planned radiation fields. The position location and shape of the radiation fields was reviewed. They targeted volume of tissue appears to be appropriately covered by the radiation beams. Organs at risk appear to be excluded as planned.  Based on my personal review, I approved the simulation verification. The patient's treatment will proceed as planned.  -----------------------------------  Blair Promise, PhD, MD  This document serves as a record of services personally performed by Gery Pray, MD. It was created on his behalf by Rae Lips, a trained medical scribe. The creation of this record is based on the scribe's personal observations and the provider's statements to them. This document has been checked and approved by the attending provider.

## 2018-07-27 ENCOUNTER — Ambulatory Visit
Admission: RE | Admit: 2018-07-27 | Discharge: 2018-07-27 | Disposition: A | Discharge status: Home / Self Care | Payer: Medicare Other | Source: Ambulatory Visit | Attending: Radiation Oncology | Admitting: Radiation Oncology

## 2018-07-27 ENCOUNTER — Other Ambulatory Visit: Payer: Self-pay

## 2018-07-27 DIAGNOSIS — Z170 Estrogen receptor positive status [ER+]: Secondary | ICD-10-CM | POA: Diagnosis not present

## 2018-07-27 DIAGNOSIS — C50411 Malignant neoplasm of upper-outer quadrant of right female breast: Secondary | ICD-10-CM | POA: Diagnosis not present

## 2018-07-27 DIAGNOSIS — Z510 Encounter for antineoplastic radiation therapy: Secondary | ICD-10-CM | POA: Diagnosis not present

## 2018-07-30 ENCOUNTER — Ambulatory Visit
Admission: RE | Admit: 2018-07-30 | Discharge: 2018-07-30 | Disposition: A | Discharge status: Home / Self Care | Payer: Medicare Other | Source: Ambulatory Visit | Attending: Radiation Oncology | Admitting: Radiation Oncology

## 2018-07-30 ENCOUNTER — Other Ambulatory Visit: Payer: Self-pay

## 2018-07-30 DIAGNOSIS — Z170 Estrogen receptor positive status [ER+]: Secondary | ICD-10-CM | POA: Diagnosis not present

## 2018-07-30 DIAGNOSIS — Z510 Encounter for antineoplastic radiation therapy: Secondary | ICD-10-CM | POA: Diagnosis not present

## 2018-07-30 DIAGNOSIS — C50411 Malignant neoplasm of upper-outer quadrant of right female breast: Secondary | ICD-10-CM | POA: Diagnosis not present

## 2018-07-31 ENCOUNTER — Ambulatory Visit
Admission: RE | Admit: 2018-07-31 | Discharge: 2018-07-31 | Disposition: A | Discharge status: Home / Self Care | Payer: Medicare Other | Source: Ambulatory Visit | Attending: Radiation Oncology | Admitting: Radiation Oncology

## 2018-07-31 ENCOUNTER — Other Ambulatory Visit: Payer: Self-pay

## 2018-07-31 DIAGNOSIS — C50411 Malignant neoplasm of upper-outer quadrant of right female breast: Secondary | ICD-10-CM | POA: Diagnosis not present

## 2018-07-31 DIAGNOSIS — Z170 Estrogen receptor positive status [ER+]: Secondary | ICD-10-CM | POA: Diagnosis not present

## 2018-07-31 DIAGNOSIS — Z510 Encounter for antineoplastic radiation therapy: Secondary | ICD-10-CM | POA: Diagnosis not present

## 2018-07-31 MED ORDER — ALRA NON-METALLIC DEODORANT (RAD-ONC)
1.0000 "application " | Freq: Once | TOPICAL | Status: AC
  Administered 2018-07-31: 1 via TOPICAL

## 2018-07-31 MED ORDER — RADIAPLEXRX EX GEL
Freq: Once | CUTANEOUS | Status: AC
  Administered 2018-07-31: 16:00:00 via TOPICAL

## 2018-08-01 ENCOUNTER — Ambulatory Visit
Admission: RE | Admit: 2018-08-01 | Discharge: 2018-08-01 | Disposition: A | Discharge status: Home / Self Care | Payer: Medicare Other | Source: Ambulatory Visit | Attending: Radiation Oncology | Admitting: Radiation Oncology

## 2018-08-01 ENCOUNTER — Other Ambulatory Visit: Payer: Self-pay

## 2018-08-01 DIAGNOSIS — C50411 Malignant neoplasm of upper-outer quadrant of right female breast: Secondary | ICD-10-CM | POA: Diagnosis not present

## 2018-08-01 DIAGNOSIS — Z170 Estrogen receptor positive status [ER+]: Secondary | ICD-10-CM | POA: Diagnosis not present

## 2018-08-01 DIAGNOSIS — Z510 Encounter for antineoplastic radiation therapy: Secondary | ICD-10-CM | POA: Diagnosis not present

## 2018-08-02 ENCOUNTER — Ambulatory Visit
Admission: RE | Admit: 2018-08-02 | Discharge: 2018-08-02 | Disposition: A | Discharge status: Home / Self Care | Payer: Medicare Other | Source: Ambulatory Visit | Attending: Radiation Oncology | Admitting: Radiation Oncology

## 2018-08-02 ENCOUNTER — Other Ambulatory Visit: Payer: Self-pay

## 2018-08-02 DIAGNOSIS — Z170 Estrogen receptor positive status [ER+]: Secondary | ICD-10-CM | POA: Diagnosis not present

## 2018-08-02 DIAGNOSIS — Z510 Encounter for antineoplastic radiation therapy: Secondary | ICD-10-CM | POA: Diagnosis not present

## 2018-08-02 DIAGNOSIS — C50411 Malignant neoplasm of upper-outer quadrant of right female breast: Secondary | ICD-10-CM | POA: Diagnosis not present

## 2018-08-03 ENCOUNTER — Ambulatory Visit
Admission: RE | Admit: 2018-08-03 | Discharge: 2018-08-03 | Disposition: A | Discharge status: Home / Self Care | Payer: Medicare Other | Source: Ambulatory Visit | Attending: Radiation Oncology | Admitting: Radiation Oncology

## 2018-08-03 ENCOUNTER — Other Ambulatory Visit: Payer: Self-pay

## 2018-08-03 DIAGNOSIS — C50411 Malignant neoplasm of upper-outer quadrant of right female breast: Secondary | ICD-10-CM | POA: Diagnosis not present

## 2018-08-03 DIAGNOSIS — Z510 Encounter for antineoplastic radiation therapy: Secondary | ICD-10-CM | POA: Diagnosis not present

## 2018-08-03 DIAGNOSIS — Z170 Estrogen receptor positive status [ER+]: Secondary | ICD-10-CM | POA: Diagnosis not present

## 2018-08-06 ENCOUNTER — Other Ambulatory Visit: Payer: Self-pay

## 2018-08-06 ENCOUNTER — Ambulatory Visit
Admission: RE | Admit: 2018-08-06 | Discharge: 2018-08-06 | Disposition: A | Discharge status: Home / Self Care | Payer: Medicare Other | Source: Ambulatory Visit | Attending: Radiation Oncology | Admitting: Radiation Oncology

## 2018-08-06 DIAGNOSIS — C50411 Malignant neoplasm of upper-outer quadrant of right female breast: Secondary | ICD-10-CM | POA: Diagnosis not present

## 2018-08-06 DIAGNOSIS — Z510 Encounter for antineoplastic radiation therapy: Secondary | ICD-10-CM | POA: Diagnosis not present

## 2018-08-06 DIAGNOSIS — Z170 Estrogen receptor positive status [ER+]: Secondary | ICD-10-CM | POA: Diagnosis not present

## 2018-08-07 ENCOUNTER — Other Ambulatory Visit: Payer: Self-pay

## 2018-08-07 ENCOUNTER — Ambulatory Visit
Admission: RE | Admit: 2018-08-07 | Discharge: 2018-08-07 | Disposition: A | Discharge status: Home / Self Care | Payer: Medicare Other | Source: Ambulatory Visit | Attending: Radiation Oncology | Admitting: Radiation Oncology

## 2018-08-07 DIAGNOSIS — Z510 Encounter for antineoplastic radiation therapy: Secondary | ICD-10-CM | POA: Diagnosis not present

## 2018-08-07 DIAGNOSIS — Z170 Estrogen receptor positive status [ER+]: Secondary | ICD-10-CM | POA: Diagnosis not present

## 2018-08-07 DIAGNOSIS — C50411 Malignant neoplasm of upper-outer quadrant of right female breast: Secondary | ICD-10-CM | POA: Diagnosis not present

## 2018-08-08 ENCOUNTER — Ambulatory Visit
Admission: RE | Admit: 2018-08-08 | Discharge: 2018-08-08 | Disposition: A | Discharge status: Home / Self Care | Payer: Medicare Other | Source: Ambulatory Visit | Attending: Radiation Oncology | Admitting: Radiation Oncology

## 2018-08-08 ENCOUNTER — Other Ambulatory Visit: Payer: Self-pay

## 2018-08-08 DIAGNOSIS — Z170 Estrogen receptor positive status [ER+]: Secondary | ICD-10-CM | POA: Diagnosis not present

## 2018-08-08 DIAGNOSIS — C50411 Malignant neoplasm of upper-outer quadrant of right female breast: Secondary | ICD-10-CM | POA: Diagnosis not present

## 2018-08-08 DIAGNOSIS — Z510 Encounter for antineoplastic radiation therapy: Secondary | ICD-10-CM | POA: Diagnosis not present

## 2018-08-09 ENCOUNTER — Other Ambulatory Visit: Payer: Self-pay

## 2018-08-09 ENCOUNTER — Ambulatory Visit
Admission: RE | Admit: 2018-08-09 | Discharge: 2018-08-09 | Disposition: A | Discharge status: Home / Self Care | Payer: Medicare Other | Source: Ambulatory Visit | Attending: Radiation Oncology | Admitting: Radiation Oncology

## 2018-08-09 DIAGNOSIS — C50411 Malignant neoplasm of upper-outer quadrant of right female breast: Secondary | ICD-10-CM | POA: Diagnosis not present

## 2018-08-09 DIAGNOSIS — Z510 Encounter for antineoplastic radiation therapy: Secondary | ICD-10-CM | POA: Diagnosis not present

## 2018-08-09 DIAGNOSIS — Z170 Estrogen receptor positive status [ER+]: Secondary | ICD-10-CM | POA: Diagnosis not present

## 2018-08-10 ENCOUNTER — Ambulatory Visit
Admission: RE | Admit: 2018-08-10 | Discharge: 2018-08-10 | Disposition: A | Discharge status: Home / Self Care | Payer: Medicare Other | Source: Ambulatory Visit | Attending: Radiation Oncology | Admitting: Radiation Oncology

## 2018-08-10 ENCOUNTER — Other Ambulatory Visit: Payer: Self-pay

## 2018-08-10 DIAGNOSIS — Z510 Encounter for antineoplastic radiation therapy: Secondary | ICD-10-CM | POA: Diagnosis not present

## 2018-08-10 DIAGNOSIS — C50411 Malignant neoplasm of upper-outer quadrant of right female breast: Secondary | ICD-10-CM | POA: Diagnosis not present

## 2018-08-10 DIAGNOSIS — Z170 Estrogen receptor positive status [ER+]: Secondary | ICD-10-CM | POA: Diagnosis not present

## 2018-08-14 ENCOUNTER — Ambulatory Visit: Payer: Medicare Other | Admitting: Radiation Oncology

## 2018-08-14 ENCOUNTER — Other Ambulatory Visit: Payer: Self-pay

## 2018-08-14 ENCOUNTER — Ambulatory Visit
Admission: RE | Admit: 2018-08-14 | Discharge: 2018-08-14 | Disposition: A | Discharge status: Home / Self Care | Payer: Medicare Other | Source: Ambulatory Visit | Attending: Radiation Oncology | Admitting: Radiation Oncology

## 2018-08-14 DIAGNOSIS — E785 Hyperlipidemia, unspecified: Secondary | ICD-10-CM | POA: Diagnosis not present

## 2018-08-14 DIAGNOSIS — Z8673 Personal history of transient ischemic attack (TIA), and cerebral infarction without residual deficits: Secondary | ICD-10-CM | POA: Diagnosis not present

## 2018-08-14 DIAGNOSIS — Z510 Encounter for antineoplastic radiation therapy: Secondary | ICD-10-CM | POA: Diagnosis not present

## 2018-08-14 DIAGNOSIS — E039 Hypothyroidism, unspecified: Secondary | ICD-10-CM | POA: Diagnosis not present

## 2018-08-14 DIAGNOSIS — Z170 Estrogen receptor positive status [ER+]: Secondary | ICD-10-CM | POA: Diagnosis not present

## 2018-08-14 DIAGNOSIS — C50411 Malignant neoplasm of upper-outer quadrant of right female breast: Secondary | ICD-10-CM | POA: Diagnosis not present

## 2018-08-15 ENCOUNTER — Other Ambulatory Visit: Payer: Self-pay

## 2018-08-15 ENCOUNTER — Ambulatory Visit
Admission: RE | Admit: 2018-08-15 | Discharge: 2018-08-15 | Disposition: A | Discharge status: Home / Self Care | Payer: Medicare Other | Source: Ambulatory Visit | Attending: Radiation Oncology | Admitting: Radiation Oncology

## 2018-08-15 DIAGNOSIS — Z170 Estrogen receptor positive status [ER+]: Secondary | ICD-10-CM | POA: Diagnosis not present

## 2018-08-15 DIAGNOSIS — C50411 Malignant neoplasm of upper-outer quadrant of right female breast: Secondary | ICD-10-CM | POA: Diagnosis not present

## 2018-08-15 DIAGNOSIS — Z510 Encounter for antineoplastic radiation therapy: Secondary | ICD-10-CM | POA: Diagnosis not present

## 2018-08-15 NOTE — Assessment & Plan Note (Signed)
06/25/2018:Right lumpectomy: Grade 3 IDC, 0.9 cm, margins negative, 0/7 lymph nodes negative, ER 100%, PR 95%, HER-2 negative, Ki-67 80%, T1 BN 0 stage Ia Oncotype DX: 24, risk of recurrence at 9 years 10% Current treatment: Adjuvant radiation therapy started 07/27/2018  Treatment plan: Adjuvant antiestrogen therapy with anastrozole 1 mg daily x5 to 7 years Anastrozole counseling:We discussed the risks and benefits of anti-estrogen therapy with aromatase inhibitors. These include but not limited to insomnia, hot flashes, mood changes, vaginal dryness, bone density loss, and weight gain. We strongly believe that the benefits far outweigh the risks. Patient understands these risks and consented to starting treatment. Planned treatment duration is 5-7 years.  Return to clinic in 3 months for survivorship care plan visit   

## 2018-08-16 ENCOUNTER — Ambulatory Visit
Admission: RE | Admit: 2018-08-16 | Discharge: 2018-08-16 | Disposition: A | Discharge status: Home / Self Care | Payer: Medicare Other | Source: Ambulatory Visit | Attending: Radiation Oncology | Admitting: Radiation Oncology

## 2018-08-16 ENCOUNTER — Other Ambulatory Visit: Payer: Self-pay

## 2018-08-16 DIAGNOSIS — Z510 Encounter for antineoplastic radiation therapy: Secondary | ICD-10-CM | POA: Diagnosis not present

## 2018-08-16 DIAGNOSIS — Z170 Estrogen receptor positive status [ER+]: Secondary | ICD-10-CM | POA: Diagnosis not present

## 2018-08-16 DIAGNOSIS — C50411 Malignant neoplasm of upper-outer quadrant of right female breast: Secondary | ICD-10-CM | POA: Diagnosis not present

## 2018-08-17 ENCOUNTER — Ambulatory Visit
Admission: RE | Admit: 2018-08-17 | Discharge: 2018-08-17 | Disposition: A | Discharge status: Home / Self Care | Payer: Medicare Other | Source: Ambulatory Visit | Attending: Radiation Oncology | Admitting: Radiation Oncology

## 2018-08-17 ENCOUNTER — Other Ambulatory Visit: Payer: Self-pay

## 2018-08-17 DIAGNOSIS — C50411 Malignant neoplasm of upper-outer quadrant of right female breast: Secondary | ICD-10-CM | POA: Diagnosis not present

## 2018-08-17 DIAGNOSIS — Z170 Estrogen receptor positive status [ER+]: Secondary | ICD-10-CM | POA: Diagnosis not present

## 2018-08-17 DIAGNOSIS — Z510 Encounter for antineoplastic radiation therapy: Secondary | ICD-10-CM | POA: Diagnosis not present

## 2018-08-20 ENCOUNTER — Ambulatory Visit
Admission: RE | Admit: 2018-08-20 | Discharge: 2018-08-20 | Disposition: A | Discharge status: Home / Self Care | Payer: Medicare Other | Source: Ambulatory Visit | Attending: Radiation Oncology | Admitting: Radiation Oncology

## 2018-08-20 ENCOUNTER — Other Ambulatory Visit: Payer: Self-pay

## 2018-08-20 DIAGNOSIS — Z170 Estrogen receptor positive status [ER+]: Secondary | ICD-10-CM | POA: Insufficient documentation

## 2018-08-20 DIAGNOSIS — Z510 Encounter for antineoplastic radiation therapy: Secondary | ICD-10-CM | POA: Diagnosis not present

## 2018-08-20 DIAGNOSIS — C50411 Malignant neoplasm of upper-outer quadrant of right female breast: Secondary | ICD-10-CM | POA: Diagnosis not present

## 2018-08-21 ENCOUNTER — Ambulatory Visit
Admission: RE | Admit: 2018-08-21 | Discharge: 2018-08-21 | Disposition: A | Discharge status: Home / Self Care | Payer: Medicare Other | Source: Ambulatory Visit | Attending: Radiation Oncology | Admitting: Radiation Oncology

## 2018-08-21 ENCOUNTER — Other Ambulatory Visit: Payer: Self-pay

## 2018-08-21 DIAGNOSIS — Z170 Estrogen receptor positive status [ER+]: Secondary | ICD-10-CM | POA: Diagnosis not present

## 2018-08-21 DIAGNOSIS — C50411 Malignant neoplasm of upper-outer quadrant of right female breast: Secondary | ICD-10-CM | POA: Diagnosis not present

## 2018-08-21 DIAGNOSIS — Z510 Encounter for antineoplastic radiation therapy: Secondary | ICD-10-CM | POA: Diagnosis not present

## 2018-08-21 NOTE — Progress Notes (Signed)
Patient Care Team: Deland Pretty, MD as PCP - General (Internal Medicine) Mauro Kaufmann, RN as Oncology Nurse Navigator Rockwell Germany, RN as Oncology Nurse Navigator  DIAGNOSIS:    ICD-10-CM   1. Malignant neoplasm of upper-outer quadrant of right breast in female, estrogen receptor positive ([place]) C50.411    Z17.0     SUMMARY OF ONCOLOGIC HISTORY:   Malignant neoplasm of upper-outer quadrant of right breast in female, estrogen receptor positive ([place])   06/06/2018 Initial Diagnosis    Screening detected right breast mass at 9 o'clock position: 6 mm by ultrasound, axilla negative, grade 3 IDC, ER 100%, PR 95%, Ki-67 80%, HER-2 1+ negative, T1BN0 stage Ia clinical stage    06/13/2018 Cancer Staging    Staging form: Breast, AJCC 8th Edition - Clinical: Stage IA (cT1b, cN0, cM0, G3, ER+, PR+, HER2-) - Signed by Nicholas Lose, MD on 06/13/2018    06/25/2018 Surgery    Right lumpectomy: Grade 3 IDC, 0.9 cm, margins negative, 0/7 lymph nodes negative, ER 100%, PR 95%, HER-2 negative, Ki-67 80%, T1 BN 0 stage Ia    07/16/2018 Oncotype testing    Oncotype DX recurrence score 24, risk of recurrence 10% with hormone therapy    07/27/2018 -  Radiation Therapy    Adjuvant XRT     CHIEF COMPLIANT: Follow-up after radiation to discuss further treatment  INTERVAL HISTORY: Alexandra Powell is a 73 y.o. with above-mentioned history of right breast cancer who underwent a lumpectomy and is currently undergoing radiation therapy. She presents to the clinic today to discuss further treatment with oral anti-estrogen therapy.  Other than mild fatigue and mild radiation dermatitis she is doing extremely well.  REVIEW OF SYSTEMS:   Constitutional: Denies fevers, chills or abnormal weight loss Eyes: Denies blurriness of vision Ears, nose, mouth, throat, and face: Denies mucositis or sore throat Respiratory: Denies cough, dyspnea or wheezes Cardiovascular: Denies palpitation, chest discomfort  Gastrointestinal: Denies nausea, heartburn or change in bowel habits Skin: Denies abnormal skin rashes Lymphatics: Denies new lymphadenopathy or easy bruising Neurological: Denies numbness, tingling or new weaknesses Behavioral/Psych: Mood is stable, no new changes  Extremities: No lower extremity edema Breast: Mild radiation dermatitis All other systems were reviewed with the patient and are negative.  I have reviewed the past medical history, past surgical history, social history and family history with the patient and they are unchanged from previous note.  ALLERGIES:  is allergic to erythromycin; keflex [cephalexin]; cephalosporins; and lipitor [atorvastatin].  MEDICATIONS:  Current Outpatient Medications  Medication Sig Dispense Refill  . cetirizine (ZYRTEC) 5 MG tablet Take 5 mg by mouth daily.    . clopidogrel (PLAVIX) 75 MG tablet Take 1 tablet (75 mg total) by mouth daily. 30 tablet 11  . co-enzyme Q-10 30 MG capsule Take 200 mg by mouth 2 (two) times daily.     Marland Kitchen levothyroxine (SYNTHROID, LEVOTHROID) 112 MCG tablet Take 88 mcg by mouth daily before breakfast.     . magnesium oxide (MAG-OX) 400 MG tablet Take 400 mg by mouth daily.    . Omega-3 Fatty Acids (FISH OIL) 500 MG CAPS Take 750 mg by mouth.     . oxyCODONE (OXY IR/ROXICODONE) 5 MG immediate release tablet Take 1 tablet (5 mg total) by mouth every 6 (six) hours as needed for moderate pain, severe pain or breakthrough pain. 10 tablet 0  . pantoprazole (PROTONIX) 40 MG tablet Take 40 mg by mouth daily. Delayed release    . rosuvastatin (  CRESTOR) 40 MG tablet Take 1 tablet (40 mg total) by mouth daily at 6 PM for 30 days. (Patient taking differently: Take 20 mg by mouth daily at 6 PM. ) 30 tablet 0  . Vitamin D, Ergocalciferol, (DRISDOL) 1.25 MG (50000 UT) CAPS capsule Take 50,000 Units by mouth every 7 (seven) days. Pt takes on Friday of each week     No current facility-administered medications for this visit.      PHYSICAL EXAMINATION: ECOG PERFORMANCE STATUS: 1 - Symptomatic but completely ambulatory  Vitals:   08/22/18 1012  BP: (!) 104/54  Pulse: 65  Resp: 16  Temp: 97.9 F (36.6 C)  SpO2: 99%   Filed Weights   08/22/18 1012  Weight: 200 lb 12.8 oz (91.1 kg)    Exam not performed due to COVID-19 precautions  LABORATORY DATA:  I have reviewed the data as listed CMP Latest Ref Rng & Units 06/13/2018 04/21/2018 04/21/2018  Glucose 70 - 99 mg/dL 100(H) - 102(H)  BUN 8 - 23 mg/dL 17 - 14  Creatinine 0.44 - 1.00 mg/dL 1.01(H) 0.90 0.92  Sodium 135 - 145 mmol/L 142 - 139  Potassium 3.5 - 5.1 mmol/L 4.4 - 3.8  Chloride 98 - 111 mmol/L 104 - 104  CO2 22 - 32 mmol/L 27 - 25  Calcium 8.9 - 10.3 mg/dL 9.6 - 9.0  Total Protein 6.5 - 8.1 g/dL 8.1 - 6.9  Total Bilirubin 0.3 - 1.2 mg/dL 0.5 - 0.5  Alkaline Phos 38 - 126 U/L 115 - 77  AST 15 - 41 U/L 28 - 22  ALT 0 - 44 U/L 30 - 24    Lab Results  Component Value Date   WBC 6.4 06/13/2018   HGB 13.2 06/13/2018   HCT 40.1 06/13/2018   MCV 88.7 06/13/2018   PLT 177 06/13/2018   NEUTROABS 3.6 06/13/2018    ASSESSMENT & PLAN:  Malignant neoplasm of upper-outer quadrant of right breast in female, estrogen receptor positive ([place]) 06/25/2018:Right lumpectomy: Grade 3 IDC, 0.9 cm, margins negative, 0/7 lymph nodes negative, ER 100%, PR 95%, HER-2 negative, Ki-67 80%, T1 BN 0 stage Ia Oncotype DX: 24, risk of recurrence at 9 years 10% Current treatment: Adjuvant radiation therapy started 07/27/2018  Treatment plan: Adjuvant antiestrogen therapy with anastrozole 1 mg daily x5 to 7 years Anastrozole counseling:We discussed the risks and benefits of anti-estrogen therapy with aromatase inhibitors. These include but not limited to insomnia, hot flashes, mood changes, vaginal dryness, bone density loss, and weight gain. We strongly believe that the benefits far outweigh the risks. Patient understands these risks and consented to starting treatment. Planned  treatment duration is 5-7 years.  Return to clinic in 3 months for survivorship care plan visit      No orders of the defined types were placed in this encounter.  The patient has a good understanding of the overall plan. she agrees with it. she will call with any problems that may develop before the next visit here.  Nicholas Lose, MD 08/22/2018  Julious Oka Dorshimer am acting as scribe for Dr. Nicholas Lose.  I have reviewed the above documentation for accuracy and completeness, and I agree with the above.

## 2018-08-22 ENCOUNTER — Other Ambulatory Visit: Payer: Self-pay

## 2018-08-22 ENCOUNTER — Inpatient Hospital Stay: Payer: Medicare Other | Attending: Hematology and Oncology | Admitting: Hematology and Oncology

## 2018-08-22 ENCOUNTER — Ambulatory Visit
Admission: RE | Admit: 2018-08-22 | Discharge: 2018-08-22 | Disposition: A | Discharge status: Home / Self Care | Payer: Medicare Other | Source: Ambulatory Visit | Attending: Radiation Oncology | Admitting: Radiation Oncology

## 2018-08-22 DIAGNOSIS — Z510 Encounter for antineoplastic radiation therapy: Secondary | ICD-10-CM | POA: Diagnosis not present

## 2018-08-22 DIAGNOSIS — C50411 Malignant neoplasm of upper-outer quadrant of right female breast: Secondary | ICD-10-CM

## 2018-08-22 DIAGNOSIS — R5383 Other fatigue: Secondary | ICD-10-CM | POA: Insufficient documentation

## 2018-08-22 DIAGNOSIS — L598 Other specified disorders of the skin and subcutaneous tissue related to radiation: Secondary | ICD-10-CM

## 2018-08-22 DIAGNOSIS — Z170 Estrogen receptor positive status [ER+]: Secondary | ICD-10-CM | POA: Insufficient documentation

## 2018-08-22 MED ORDER — ANASTROZOLE 1 MG PO TABS: 1.0000 mg | ORAL_TABLET | Freq: Every day | ORAL | 3 refills | Status: DC

## 2018-08-23 ENCOUNTER — Telehealth: Payer: Self-pay | Admitting: Hematology and Oncology

## 2018-08-23 ENCOUNTER — Other Ambulatory Visit: Payer: Self-pay

## 2018-08-23 ENCOUNTER — Encounter: Payer: Self-pay | Admitting: Radiation Oncology

## 2018-08-23 ENCOUNTER — Ambulatory Visit
Admission: RE | Admit: 2018-08-23 | Discharge: 2018-08-23 | Disposition: A | Discharge status: Home / Self Care | Payer: Medicare Other | Source: Ambulatory Visit | Attending: Radiation Oncology | Admitting: Radiation Oncology

## 2018-08-23 DIAGNOSIS — C50411 Malignant neoplasm of upper-outer quadrant of right female breast: Secondary | ICD-10-CM | POA: Diagnosis not present

## 2018-08-23 DIAGNOSIS — Z170 Estrogen receptor positive status [ER+]: Secondary | ICD-10-CM | POA: Diagnosis not present

## 2018-08-23 DIAGNOSIS — Z510 Encounter for antineoplastic radiation therapy: Secondary | ICD-10-CM | POA: Diagnosis not present

## 2018-08-23 NOTE — Telephone Encounter (Signed)
I talk with patient regarding schedule  

## 2018-08-27 NOTE — Progress Notes (Signed)
  Patient Name: Alexandra Powell MRN: [id number] DOB: 01/03/1946 Referring Physician: Deland Pretty (Profile Not Attached) Date of Service: 08/23/2018 [facility], [place]                                                        End Of Treatment Note  Diagnoses:  Right breast UOQ Invasive Ductal Carcinoma, ER+/PR+, Her2- Grade III  Cancer Staging: Stage 1A (pT1b, pN0)  Indication for Treatment: Curative  Radiation Treatment Dates: 07/26/2018 through 08/23/2018 Site Technique Total Dose Dose per Fx Completed Fx Beam Energies  Breast: Breast_Rt 3D 40.05/40.05 2.67 15/15 6X, 10X  Breast: Breast_Rt_Bst 3D 10/10 2 5/5 6X, 10X   Narrative: The patient tolerated radiation therapy relatively well. She denied pain throughout treatments. Toward the end of her therapy, she reported moderate fatigue. Her skin became slightly hyperpigmented, red and swollen. She endorsed using Radiaplex as directed.   Plan: The patient will follow-up with radiation oncology in 1 month .  ________________________________________________ -----------------------------------  Blair Promise, PhD, MD This document serves as a record of services personally performed by Gery Pray, MD. It was created on his behalf by Mary-Margaret Loma Messing, a trained medical scribe. The creation of this record is based on the scribe's personal observations and the provider's statements to them. This document has been checked and approved by the attending provider.

## 2018-09-10 ENCOUNTER — Encounter: Payer: Self-pay | Admitting: *Deleted

## 2018-09-24 ENCOUNTER — Ambulatory Visit
Admission: RE | Admit: 2018-09-24 | Discharge: 2018-09-24 | Disposition: A | Discharge status: Home / Self Care | Payer: Medicare Other | Source: Ambulatory Visit | Attending: Radiation Oncology | Admitting: Radiation Oncology

## 2018-09-24 ENCOUNTER — Other Ambulatory Visit: Payer: Self-pay

## 2018-09-24 ENCOUNTER — Encounter: Payer: Self-pay | Admitting: Radiation Oncology

## 2018-09-24 VITALS — BP 104/69 | HR 68 | Temp 98.3°F | Resp 20 | Ht 61.5 in | Wt 199.0 lb

## 2018-09-24 DIAGNOSIS — Z923 Personal history of irradiation: Secondary | ICD-10-CM | POA: Diagnosis not present

## 2018-09-24 DIAGNOSIS — Z79811 Long term (current) use of aromatase inhibitors: Secondary | ICD-10-CM | POA: Insufficient documentation

## 2018-09-24 DIAGNOSIS — C50411 Malignant neoplasm of upper-outer quadrant of right female breast: Secondary | ICD-10-CM | POA: Diagnosis not present

## 2018-09-24 DIAGNOSIS — R5383 Other fatigue: Secondary | ICD-10-CM | POA: Insufficient documentation

## 2018-09-24 DIAGNOSIS — Z170 Estrogen receptor positive status [ER+]: Secondary | ICD-10-CM | POA: Diagnosis not present

## 2018-09-24 DIAGNOSIS — B372 Candidiasis of skin and nail: Secondary | ICD-10-CM | POA: Diagnosis not present

## 2018-09-24 DIAGNOSIS — Z79899 Other long term (current) drug therapy: Secondary | ICD-10-CM | POA: Insufficient documentation

## 2018-09-24 NOTE — Progress Notes (Signed)
Radiation Oncology         [phone] ________________________________  Name: Alexandra Powell MRN: [id number]  Date: 09/24/2018  DOB: Jul 06, 1945  Follow-Up Visit Note  CC: Deland Pretty, MD  Deland Pretty, MD    ICD-10-CM   1. Malignant neoplasm of upper-outer quadrant of right breast in female, estrogen receptor positive [facility])  C50.411    Z17.0     Diagnosis:   Right breast UOQ Invasive Ductal Carcinoma, ER+/PR+, Her2- Grade III   Interval Since Last Radiation:  1 months   Radiation Treatment Dates: 07/26/2018 through 08/23/2018 Site Technique Total Dose Dose per Fx Completed Fx Beam Energies  Breast: Breast_Rt 3D 40.05/40.05 2.67 15/15 6X, 10X  Breast: Breast_Rt_Bst 3D 10/10 2 5/5 6X, 10X     Narrative:  The patient returns today for routine follow-up.  she is doing well overall.      On review of systems, she reports mild fatigue but no breast discomfort or itching.  She did have some itching in the inframammary fold near her scar this is resolved at this time.. she denies nipple discharge or bleeding and any other symptoms. Pertinent positives are listed and detailed within the above HPI.  sHe did meet with Dr. Lindi Adie and patient was started on adjuvant hormonal therapy which she is tolerating well at this time                 ALLERGIES:  is allergic to erythromycin; keflex [cephalexin]; cephalosporins; and lipitor [atorvastatin].  Meds: Current Outpatient Medications  Medication Sig Dispense Refill  . anastrozole (ARIMIDEX) 1 MG tablet Take 1 tablet (1 mg total) by mouth daily. 90 tablet 3  . cetirizine (ZYRTEC) 5 MG tablet Take 5 mg by mouth daily.    . clopidogrel (PLAVIX) 75 MG tablet Take 1 tablet (75 mg total) by mouth daily. 30 tablet 11  . co-enzyme Q-10 30 MG capsule Take 200 mg by mouth 2 (two) times daily.     Arna Medici 75 MCG tablet TAKE 1 TABLET BY MOUTH IN THE MORNING ON AN EMPTY STOMACH ONCE DAILY FOR 30 DAYS    . magnesium oxide (MAG-OX) 400 MG tablet  Take 400 mg by mouth daily.    . Omega-3 Fatty Acids (FISH OIL) 500 MG CAPS Take 750 mg by mouth.     . pantoprazole (PROTONIX) 40 MG tablet Take 40 mg by mouth daily. Delayed release    . rosuvastatin (CRESTOR) 40 MG tablet Take 1 tablet (40 mg total) by mouth daily at 6 PM for 30 days. (Patient taking differently: Take 20 mg by mouth daily at 6 PM. ) 30 tablet 0  . Vitamin D, Ergocalciferol, (DRISDOL) 1.25 MG (50000 UT) CAPS capsule Take 50,000 Units by mouth every 7 (seven) days. Pt takes on Friday of each week    . oxyCODONE (OXY IR/ROXICODONE) 5 MG immediate release tablet Take 1 tablet (5 mg total) by mouth every 6 (six) hours as needed for moderate pain, severe pain or breakthrough pain. (Patient not taking: Reported on 09/24/2018) 10 tablet 0   No current facility-administered medications for this encounter.     Physical Findings: The patient is in no acute distress. Patient is alert and oriented.  height is 5' 1.5" (1.562 m) and weight is 199 lb (90.3 kg). Her oral temperature is 98.3 F (36.8 C). Her blood pressure is 104/69 and her pulse is 68. Her respiration is 20 and oxygen saturation is 100%. .  Lungs are clear to auscultation  bilaterally. Heart has regular rate and rhythm. No palpable cervical, supraclavicular, or axillary adenopathy. Abdomen soft, non-tender, normal bowel sounds.  Left breast large and pendulous without mass or nipple discharge or bleeding.  Right breast large and pendulous.  Mild hyperpigmentation changes.  Patient skin is healed well at this time.  No dominant mass appreciated breast nipple discharge or bleeding.  Patient does have erythema in the inframammary fold and adjacent to her lumpectomy scar suspicious for yeast infection.  Lab Findings: Lab Results  Component Value Date   WBC 6.4 06/13/2018   HGB 13.2 06/13/2018   HCT 40.1 06/13/2018   MCV 88.7 06/13/2018   PLT 177 06/13/2018    Radiographic Findings: No results found.  Impression:  The patient  is recovering from the effects of radiation.  No evidence of recurrence on clinical exam today.  Patient appears to have a yeast infection in the inframammary fold and was given miconazole powder to place in this area.  She will let us know if this does not take care of the erythema.  Plan: prn follow-up in radiation oncology.  The patient will continue close follow-up in medical oncology and remain on anastrozole for approximately 7 years.  ____________________________________   Blair Promise, PhD, MD    This document serves as a record of services personally performed by Gery Pray, MD. It was created on his behalf by Mary-Margaret Loma Messing, a trained medical scribe. The creation of this record is based on the scribe's personal observations and the provider's statements to them. This document has been checked and approved by the attending provider.

## 2018-09-24 NOTE — Patient Instructions (Signed)
Coronavirus (COVID-19) Are you at risk?  Are you at risk for the Coronavirus (COVID-19)?  To be considered HIGH RISK for Coronavirus (COVID-19), you have to meet the following criteria:  . Traveled to [place], [place], [place], [place] or [place]; or in the [place] to [place], [place], [place], or [place]; and have fever, cough, and shortness of breath within the last 2 weeks of travel OR . Been in close contact with a person diagnosed with COVID-19 within the last 2 weeks and have fever, cough, and shortness of breath . IF YOU DO NOT MEET THESE CRITERIA, YOU ARE CONSIDERED LOW RISK FOR COVID-19.  What to do if you are HIGH RISK for COVID-19?  . If you are having a medical emergency, call 911. . Seek medical care right away. Before you go to a doctor's office, urgent care or emergency department, call ahead and tell them about your recent travel, contact with someone diagnosed with COVID-19, and your symptoms. You should receive instructions from your physician's office regarding next steps of care.  . When you arrive at healthcare provider, tell the healthcare staff immediately you have returned from visiting [place], [place], [place], [place] or [place]; or traveled in the [place] to [place], [place], [place], or [place]; in the last two weeks or you have been in close contact with a person diagnosed with COVID-19 in the last 2 weeks.   . Tell the health care staff about your symptoms: fever, cough and shortness of breath. . After you have been seen by a medical provider, you will be either: o Tested for (COVID-19) and discharged home on quarantine except to seek medical care if symptoms worsen, and asked to  - Stay home and avoid contact with others until you get your results (4-5 days)  - Avoid travel on public transportation if possible (such as bus, train, or airplane) or o Sent to the Emergency Department by EMS for evaluation, COVID-19 testing, and possible  admission depending on your condition and test results.  What to do if you are LOW RISK for COVID-19?  Reduce your risk of any infection by using the same precautions used for avoiding the common cold or flu:  . Wash your hands often with soap and warm water for at least 20 seconds.  If soap and water are not readily available, use an alcohol-based hand sanitizer with at least 60% alcohol.  . If coughing or sneezing, cover your mouth and nose by coughing or sneezing into the elbow areas of your shirt or coat, into a tissue or into your sleeve (not your hands). . Avoid shaking hands with others and consider head nods or verbal greetings only. . Avoid touching your eyes, nose, or mouth with unwashed hands.  . Avoid close contact with people who are sick. . Avoid places or events with large numbers of people in one location, like concerts or sporting events. . Carefully consider travel plans you have or are making. . If you are planning any travel outside or inside the [place], visit the CDC's Travelers' Health webpage for the latest health notices. . If you have some symptoms but not all symptoms, continue to monitor at home and seek medical attention if your symptoms worsen. . If you are having a medical emergency, call 911.   ADDITIONAL HEALTHCARE OPTIONS FOR PATIENTS   Telehealth / e-Visit: [email or web address]         [facility]: [phone]  [place]   [facility]: [phone]                   [facility]: [phone]   

## 2018-09-24 NOTE — Progress Notes (Signed)
Pt presents today for f/u with Dr. Sondra Come. Pt reports fatigue is likely age-related. Pt denies c/o pain in breast, with exception of "occasional twinge but nothing I call pain". Pt continues to use Radiaplex on breast. Breast slightly pink underneath. Skin intact.  BP 104/69 (BP Location: Left Arm, Patient Position: Sitting)   Pulse 68   Temp 98.3 F (36.8 C) (Oral)   Resp 20   Ht 5' 1.5" (1.562 m)   Wt 199 lb (90.3 kg)   SpO2 100%   BMI 36.99 kg/m   Wt Readings from Last 3 Encounters:  09/24/18 199 lb (90.3 kg)  08/22/18 200 lb 12.8 oz (91.1 kg)  07/18/18 206 lb 3.2 oz (93.5 kg)   Loma Sousa, RN BSN

## 2018-11-23 ENCOUNTER — Encounter: Payer: Medicare Other | Admitting: Adult Health

## 2018-11-29 ENCOUNTER — Ambulatory Visit (INDEPENDENT_AMBULATORY_CARE_PROVIDER_SITE_OTHER): Payer: Medicare Other | Admitting: Adult Health

## 2018-11-29 ENCOUNTER — Other Ambulatory Visit: Payer: Self-pay

## 2018-11-29 ENCOUNTER — Encounter: Payer: Self-pay | Admitting: Adult Health

## 2018-11-29 VITALS — BP 130/85 | HR 55 | Temp 98.4°F | Ht 61.5 in | Wt 196.0 lb

## 2018-11-29 DIAGNOSIS — I639 Cerebral infarction, unspecified: Secondary | ICD-10-CM

## 2018-11-29 DIAGNOSIS — I6381 Other cerebral infarction due to occlusion or stenosis of small artery: Secondary | ICD-10-CM

## 2018-11-29 DIAGNOSIS — E785 Hyperlipidemia, unspecified: Secondary | ICD-10-CM

## 2018-11-29 NOTE — Progress Notes (Signed)
[facility] [address] [place]. [place] [address] [phone]       OFFICE FOLLOW UP NOTE  Alexandra Powell Date of Birth:  [birth year] Medical Record Number:  [id number]   Referring MD: Fayrene Helper Reason for Referral: Stroke  Chief Complaint  Patient presents with   Follow-up    6 mon f/u. Alone. Rm 9. No new concerns at this time.       HPI: Alexandra Powell is a 73 year old pleasant Caucasian lady seen today for initial office consultation visit for stroke.  History is obtained from the patient and review of hospital electronic medical records.  I personally reviewed imaging films in PACS.  She is a 73 year old lady with past medical history of hypothyroidism, gastroesophageal reflux disease and vitamin D deficiency who presented on 04/21/2018 for evaluation.  She woke up in the morning at 8 AM and felt fine soon after she went to the bathroom she described a funny feeling and noticed numbness on the left side of her cheek face and arm.  Left arm felt a little heavy.  She denies any slurred speech, facial droop, extremity weakness gait or balance problems.  Upon arrival in the emergency room her blood pressure was found to be elevated with systolics greater than [id number].  Code stroke was activated but because of minimum sensory symptoms TPA was not given.  She was admitted for evaluation.  CT angiogram of the brain and neck both did not reveal any large vessel stenosis or occlusion.  MRI scan of the brain showed a small right lateral thalamic lacunar infarct.  LDL cholesterol was 155 mg percent and hemoglobin A1c was 6.0.  Transthoracic echo showed normal ejection fraction.  Urine drug screen was negative.  Patient's numbness recovered within 24 hours and by the next day she was back to her baseline.  She was started on aspirin and Plavix as well as Crestor 40 mg.  She states she is done well since discharge.  Her primary care physician has cut back the dose of Crestor  to 20 mg and he plans to repeat lipid profile next week.  Patient states her blood pressure is usually runs in the 120s at home though today it is elevated in office at 140/69.  She is tolerating aspirin Plavix well without bruising or bleeding.  She has no complaints today.  She has no prior history of strokes TIAs seizures or significant neurological problems.  She does not smoke or drink alcohol.  Update 11/29/2018: Alexandra Powell is being seen today for 75-month stroke follow-up visit.  She has been doing well since prior visit without recurrent or new stroke/TIA symptoms.  She has continued on Plavix and Crestor for secondary stroke prevention without side effects.  Blood pressure today satisfactory at 130/85.  She continues to follow with PCP for HTN and HLD management.  No concerns at this time.    ROS:   14 system review of systems is positive for no concerns and all other systems negative     PMH:  Past Medical History:  Diagnosis Date   Cancer ([place]) 05/2018    right breast IDC   GERD (gastroesophageal reflux disease)    Hypothyroidism (acquired)    Pre-diabetes    Stroke ([place]) 04/2018   do deficits   Vitamin D deficiency     Social History:  Social History   Socioeconomic History   Marital status: Married    Spouse name: Not on file   Number of  children: Not on file   Years of education: Not on file   Highest education level: Not on file  Occupational History   Occupation: retired  Scientist, product/process development strain: Not on file   Food insecurity    Worry: Not on file    Inability: Not on Lexicographer needs    Medical: Not on file    Non-medical: Not on file  Tobacco Use   Smoking status: Never Smoker   Smokeless tobacco: Never Used  Substance and Sexual Activity   Alcohol use: Yes    Comment: social   Drug use: No   Sexual activity: Not on file  Lifestyle   Physical activity    Days per week: Not on file    Minutes per  session: Not on file   Stress: Not on file  Relationships   Social connections    Talks on phone: Not on file    Gets together: Not on file    Attends religious service: Not on file    Active member of club or organization: Not on file    Attends meetings of clubs or organizations: Not on file    Relationship status: Not on file   Intimate partner violence    Fear of current or ex partner: Not on file    Emotionally abused: Not on file    Physically abused: Not on file    Forced sexual activity: Not on file  Other Topics Concern   Not on file  Social History Narrative   Not on file    Medications:   Current Outpatient Medications on File Prior to Visit  Medication Sig Dispense Refill   anastrozole (ARIMIDEX) 1 MG tablet Take 1 tablet (1 mg total) by mouth daily. 90 tablet 3   cetirizine (ZYRTEC) 5 MG tablet Take 5 mg by mouth daily.     clopidogrel (PLAVIX) 75 MG tablet Take 1 tablet (75 mg total) by mouth daily. 30 tablet 11   co-enzyme Q-10 30 MG capsule Take 200 mg by mouth 2 (two) times daily.      EUTHYROX 75 MCG tablet TAKE 1 TABLET BY MOUTH IN THE MORNING ON AN EMPTY STOMACH ONCE DAILY FOR 30 DAYS     magnesium oxide (MAG-OX) 400 MG tablet Take 400 mg by mouth daily.     Omega-3 Fatty Acids (FISH OIL) 500 MG CAPS Take 750 mg by mouth.      pantoprazole (PROTONIX) 40 MG tablet Take 40 mg by mouth daily. Delayed release     rosuvastatin (CRESTOR) 20 MG tablet Take 20 mg by mouth daily.     Vitamin D, Ergocalciferol, (DRISDOL) 1.25 MG (50000 UT) CAPS capsule Take 50,000 Units by mouth every 7 (seven) days. Pt takes on Friday of each week     oxyCODONE (OXY IR/ROXICODONE) 5 MG immediate release tablet Take 1 tablet (5 mg total) by mouth every 6 (six) hours as needed for moderate pain, severe pain or breakthrough pain. (Patient not taking: Reported on 09/24/2018) 10 tablet 0   No current facility-administered medications on file prior to visit.     Allergies:     Allergies  Allergen Reactions   Erythromycin     "stomach on fire"   Keflex [Cephalexin] Nausea Only   Cephalosporins Rash   Lipitor [Atorvastatin] Other (See Comments)    Joint acheness    Today's Vitals   11/29/18 1053  BP: 130/85  Pulse: (!) 55  Temp: 98.4 F (  36.9 C)  TempSrc: Oral  Weight: 196 lb (88.9 kg)  Height: 5' 1.5" (1.562 m)   Body mass index is 36.43 kg/m.   Physical Exam General: Pleasant elderly Caucasian lady seated, in no evident distress Head: head normocephalic and atraumatic.   Neck: supple with no carotid or supraclavicular bruits Cardiovascular: regular rate and rhythm, no murmurs Musculoskeletal: no deformity Skin:  no rash/petichiae Vascular:  Normal pulses all extremities  Neurologic Exam Mental Status: Awake and fully alert. Oriented to place and time. Recent and remote memory intact. Attention span, concentration and fund of knowledge appropriate. Mood and affect appropriate.  Cranial Nerves: Pupils equal, briskly reactive to light. Extraocular movements full without nystagmus. Visual fields full to confrontation. Hearing intact. Facial sensation intact. Face, tongue, palate moves normally and symmetrically.  Motor: Normal bulk and tone. Normal strength in all tested extremity muscles. Sensory.: intact to touch , pinprick , position and vibratory sensation.  Coordination: Rapid alternating movements normal in all extremities. Finger-to-nose and heel-to-shin performed accurately bilaterally. Gait and Station: Arises from chair without difficulty. Stance is normal. Gait demonstrates normal stride length and balance .  Reflexes: 1+ and symmetric. Toes downgoing.     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  Vascular risk factors of hyperlipidemia and age only.  Recovered well without residual deficits or reoccurring symptoms.     PLAN: -Continue clopidogrel  and Crestor for secondary stroke prevention -Continue to follow with PCP for HTN and HLD management. -Continue to stay active and maintain a healthy diet -Continue to monitor BP at home - maintain strict control of hypertension with blood pressure goal below 130/90, diabetes with hemoglobin A1c goal below 6.5% and lipids with LDL cholesterol goal below 70 mg/dL. I also advised the patient to eat a healthy diet with plenty of whole grains, cereals, fruits and vegetables, exercise regularly and maintain ideal body weight.  Stable from a stroke standpoint recommend follow-up as needed    Greater than 50% time during this 25-minute visit was spent on counseling and coordination of care about her recent lacunar stroke and discussion about stroke prevention and treatment and answering questions.    Frann Rider, AGNP-BC  [facility] [address] [place], [place] [address]  Phone [phone] Fax [phone] Note: This document was prepared with digital dictation and possible smart phrase technology. Any transcriptional errors that result from this process are unintentional.

## 2018-11-29 NOTE — Patient Instructions (Signed)
Continue clopidogrel 75 mg daily  and Crestor  for secondary stroke prevention  Continue to follow up with PCP regarding cholesterol and blood pressure management   Continue to monitor blood pressure at home  Maintain strict control of hypertension with blood pressure goal below 130/90, diabetes with hemoglobin A1c goal below 6.5% and cholesterol with LDL cholesterol (bad cholesterol) goal below 70 mg/dL. I also advised the patient to eat a healthy diet with plenty of whole grains, cereals, fruits and vegetables, exercise regularly and maintain ideal body weight.        Thank you for coming to see [place] at [facility]. I hope we have been able to provide you high quality care today.  You may receive a patient satisfaction survey over the next few weeks. We would appreciate your feedback and comments so that we may continue to improve ourselves and the health of our patients.

## 2018-12-02 ENCOUNTER — Encounter: Payer: Self-pay | Admitting: Adult Health

## 2018-12-04 ENCOUNTER — Telehealth: Payer: Self-pay | Admitting: Oncology

## 2018-12-04 NOTE — Telephone Encounter (Signed)
[place] [facility] 9/25 moved from 9/25 to 9/21. Confirmed with patient.

## 2018-12-05 NOTE — Progress Notes (Signed)
I agree with the above plan 

## 2018-12-10 ENCOUNTER — Encounter: Payer: Self-pay | Admitting: Adult Health

## 2018-12-10 ENCOUNTER — Other Ambulatory Visit: Payer: Self-pay

## 2018-12-10 ENCOUNTER — Inpatient Hospital Stay: Payer: Medicare Other | Attending: Adult Health | Admitting: Adult Health

## 2018-12-10 VITALS — BP 116/94 | HR 66 | Temp 98.2°F | Resp 16 | Ht 61.5 in | Wt 195.2 lb

## 2018-12-10 DIAGNOSIS — C50411 Malignant neoplasm of upper-outer quadrant of right female breast: Secondary | ICD-10-CM | POA: Diagnosis not present

## 2018-12-10 DIAGNOSIS — Z170 Estrogen receptor positive status [ER+]: Secondary | ICD-10-CM | POA: Insufficient documentation

## 2018-12-10 DIAGNOSIS — E2839 Other primary ovarian failure: Secondary | ICD-10-CM

## 2018-12-10 DIAGNOSIS — I639 Cerebral infarction, unspecified: Secondary | ICD-10-CM

## 2018-12-10 NOTE — Progress Notes (Signed)
CLINIC:  Survivorship   REASON FOR VISIT:  Routine follow-up post-treatment for a recent history of breast cancer.  BRIEF ONCOLOGIC HISTORY:  Oncology History  Malignant neoplasm of upper-outer quadrant of right breast in female, estrogen receptor positive ([place])  06/06/2018 Initial Diagnosis   Screening detected right breast mass at 9 o'clock position: 6 mm by ultrasound, axilla negative, grade 3 IDC, ER 100%, PR 95%, Ki-67 80%, HER-2 1+ negative, T1BN0 stage Ia clinical stage   06/13/2018 Cancer Staging   Staging form: Breast, AJCC 8th Edition - Clinical: Stage IA (cT1b, cN0, cM0, G3, ER+, PR+, HER2-) - Signed by Nicholas Lose, MD on 06/13/2018   06/25/2018 Surgery   Right lumpectomy: Grade 3 IDC, 0.9 cm, margins negative, 0/7 lymph nodes negative, ER 100%, PR 95%, HER-2 negative, Ki-67 80%, T1 BN 0 stage Ia   06/25/2018 Cancer Staging   Staging form: Breast, AJCC 8th Edition - Pathologic stage from 06/25/2018: Stage IA (pT1b, pN0, cM0, G3, ER+, PR+, HER2-, Oncotype DX score: 24) - Signed by Gardenia Phlegm, NP on 11/28/2018   07/16/2018 Oncotype testing   Oncotype DX recurrence score 24, risk of recurrence 10% with hormone therapy   07/27/2018 -  Radiation Therapy   Adjuvant XRT   08/2018 -  Anti-estrogen oral therapy   Anastrozole daily     INTERVAL HISTORY:  Alexandra Powell presents to the [facility] today for our initial meeting to review her survivorship care plan detailing her treatment course for breast cancer, as well as monitoring long-term side effects of that treatment, education regarding health maintenance, screening, and overall wellness and health promotion.     Overall, Alexandra Powell reports feeling quite well.  She is taking Anastrozole daily and is tolerating it well.  She denies any excessive achiness, hot flashes, or vaginal dryness.  She remains quite active, and is enjoying life.      REVIEW OF SYSTEMS:  Review of Systems  Constitutional: Negative  for appetite change, chills, fatigue, fever and unexpected weight change.  HENT:   Negative for hearing loss, lump/mass and trouble swallowing.   Eyes: Negative for eye problems and icterus.  Respiratory: Negative for chest tightness, cough and shortness of breath.   Cardiovascular: Negative for chest pain, leg swelling and palpitations.  Gastrointestinal: Negative for abdominal distention, abdominal pain, constipation, diarrhea, nausea and vomiting.  Endocrine: Negative for hot flashes.  Genitourinary: Negative for difficulty urinating.   Musculoskeletal: Negative for arthralgias.  Skin: Negative for itching and rash.  Neurological: Negative for dizziness, extremity weakness, headaches and numbness.  Hematological: Negative for adenopathy. Does not bruise/bleed easily.  Psychiatric/Behavioral: Negative for depression. The patient is not nervous/anxious.   Breast: Denies any new nodularity, masses, tenderness, nipple changes, or nipple discharge.      ONCOLOGY TREATMENT TEAM:  1. Surgeon:  Dr. Donne Hazel at [facility] 2. Medical Oncologist: Dr. Lindi Adie  3. Radiation Oncologist: Dr. Sondra Come    PAST MEDICAL/SURGICAL HISTORY:  Past Medical History:  Diagnosis Date  . Cancer ([place]) 05/2018    right breast IDC  . GERD (gastroesophageal reflux disease)   . Hypothyroidism (acquired)   . Pre-diabetes   . Stroke ([place]) 04/2018   do deficits  . Vitamin D deficiency    Past Surgical History:  Procedure Laterality Date  . ABDOMINAL HYSTERECTOMY    . BREAST LUMPECTOMY WITH RADIOACTIVE SEED AND SENTINEL LYMPH NODE BIOPSY Right 06/25/2018   Procedure: RIGHT BREAST LUMPECTOMY WITH RADIOACTIVE SEED AND RIGHT AXILLARY SENTINEL LYMPH NODE BIOPSY;  Surgeon: Rolm Bookbinder, MD;  Location: [place];  Service: General;  Laterality: Right;  . HYSTERECTOMY ABDOMINAL WITH SALPINGO-OOPHORECTOMY     remote ovarian cancer  . TONSILLECTOMY       ALLERGIES:  Allergies   Allergen Reactions  . Erythromycin     "stomach on fire"  . Keflex [Cephalexin] Nausea Only  . Cephalosporins Rash  . Lipitor [Atorvastatin] Other (See Comments)    Joint acheness     CURRENT MEDICATIONS:  Outpatient Encounter Medications as of 12/10/2018  Medication Sig  . anastrozole (ARIMIDEX) 1 MG tablet Take 1 tablet (1 mg total) by mouth daily.  . cetirizine (ZYRTEC) 5 MG tablet Take 5 mg by mouth daily.  . clopidogrel (PLAVIX) 75 MG tablet Take 1 tablet (75 mg total) by mouth daily.  Marland Kitchen co-enzyme Q-10 30 MG capsule Take 200 mg by mouth 2 (two) times daily.   Arna Medici 75 MCG tablet TAKE 1 TABLET BY MOUTH IN THE MORNING ON AN EMPTY STOMACH ONCE DAILY FOR 30 DAYS  . magnesium oxide (MAG-OX) 400 MG tablet Take 400 mg by mouth daily.  . Omega-3 Fatty Acids (FISH OIL) 500 MG CAPS Take 750 mg by mouth.   . pantoprazole (PROTONIX) 40 MG tablet Take 40 mg by mouth daily. Delayed release  . rosuvastatin (CRESTOR) 20 MG tablet Take 20 mg by mouth daily.  . Vitamin D, Ergocalciferol, (DRISDOL) 1.25 MG (50000 UT) CAPS capsule Take 50,000 Units by mouth every 7 (seven) days. Pt takes on Friday of each week   No facility-administered encounter medications on file as of 12/10/2018.      ONCOLOGIC FAMILY HISTORY:  Family History  Problem Relation Age of Onset  . Stroke Mother   . Lung cancer Mother   . Stroke Maternal Grandfather   . Colon cancer Father   . Prostate cancer Father   . Lung cancer Father   . Breast cancer Sister   . Lung cancer Brother   . Lung cancer Maternal Aunt   . Pancreatic cancer Paternal Uncle      GENETIC COUNSELING/TESTING: Not at this time  SOCIAL HISTORY:  Social History   Socioeconomic History  . Marital status: Married    Spouse name: Not on file  . Number of children: Not on file  . Years of education: Not on file  . Highest education level: Not on file  Occupational History  . Occupation: retired  Scientific laboratory technician  . Financial resource  strain: Not on file  . Food insecurity    Worry: Not on file    Inability: Not on file  . Transportation needs    Medical: Not on file    Non-medical: Not on file  Tobacco Use  . Smoking status: Never Smoker  . Smokeless tobacco: Never Used  Substance and Sexual Activity  . Alcohol use: Yes    Comment: social  . Drug use: No  . Sexual activity: Not on file  Lifestyle  . Physical activity    Days per week: Not on file    Minutes per session: Not on file  . Stress: Not on file  Relationships  . Social Herbalist on phone: Not on file    Gets together: Not on file    Attends religious service: Not on file    Active member of club or organization: Not on file    Attends meetings of clubs or organizations: Not on file    Relationship status: Not on  file  . Intimate partner violence    Fear of current or ex partner: Not on file    Emotionally abused: Not on file    Physically abused: Not on file    Forced sexual activity: Not on file  Other Topics Concern  . Not on file  Social History Narrative  . Not on file      PHYSICAL EXAMINATION:  Vital Signs:   Vitals:   12/10/18 1239  BP: (!) 116/94  Pulse: 66  Resp: 16  Temp: 98.2 F (36.8 C)  SpO2: 100%   Filed Weights   12/10/18 1239  Weight: 195 lb 3.2 oz (88.5 kg)   General: Well-nourished, well-appearing female in no acute distress.  She is unaccompanied today.   HEENT: Head is normocephalic.  Pupils equal and reactive to light. Conjunctivae clear without exudate.  Sclerae anicteric. Oral mucosa is pink, moist.  Oropharynx is pink without lesions or erythema.  Lymph: No cervical, supraclavicular, or infraclavicular lymphadenopathy noted on palpation.  Cardiovascular: Regular rate and rhythm.Marland Kitchen Respiratory: Clear to auscultation bilaterally. Chest expansion symmetric; breathing non-labored.  Breasts: Right breast s/p lumpectomy and radiation, no sign of local recurrence, left breast benign GI: Abdomen  soft and round; non-tender, non-distended. Bowel sounds normoactive.  GU: Deferred.  Neuro: No focal deficits. Steady gait.  Psych: Mood and affect normal and appropriate for situation.  Extremities: No edema. MSK: No focal spinal tenderness to palpation.  Full range of motion in bilateral upper extremities Skin: Warm and dry.  LABORATORY DATA:  None for this visit.  DIAGNOSTIC IMAGING:  None for this visit.      ASSESSMENT AND PLAN:  Ms.. Powell is a pleasant 73 y.o. female with Stage IA right breast invasive ductal carcinoma, ER+/PR+/HER2-, diagnosed in 05/2018, treated with lumpectomy, adjuvant radiation therapy, and anti-estrogen therapy with Anastrozole beginning in 08/2018.  She presents to the Survivorship Clinic for our initial meeting and routine follow-up post-completion of treatment for breast cancer.    1. Stage IA right breast cancer:  Alexandra Powell is continuing to recover from definitive treatment for breast cancer. She will follow-up with her medical oncologist, Dr. Lindi Adie in 05/2019 with history and physical exam per surveillance protocol.  She will continue her anti-estrogen therapy with Anastrozole. Thus far, she is tolerating the Anastrozole well, with minimal side effects. She is due for mammogram in 05/2019; orders placed today. Today, a comprehensive survivorship care plan and treatment summary was reviewed with the patient today detailing her breast cancer diagnosis, treatment course, potential late/long-term effects of treatment, appropriate follow-up care with recommendations for the future, and patient education resources.  A copy of this summary, along with a letter will be sent to the patient's primary care provider via mail/fax/In Basket message after today's visit.    2. Bone health:  Given Alexandra Powell's age/history of breast cancer and her current treatment regimen including anti-estrogen therapy with Anastrozole, she is at risk for bone demineralization.  She  cannot recall when her last bone density test was, but thinks it was several years ago.  I placed orders for this to be repeated in 05/2019 when she has her mammogram.  She was given education on specific activities to promote bone health.  3. Cancer screening:  Due to Alexandra Powell's history and her age, she should receive screening for skin cancers, colon cancer, and gynecologic cancers.  The information and recommendations are listed on the patient's comprehensive care plan/treatment summary and were reviewed in detail with the patient.  4. Health maintenance and wellness promotion: Alexandra Powell was encouraged to consume 5-7 servings of fruits and vegetables per day. We reviewed the "Nutrition Rainbow" handout, as well as the handout "Take Control of Your Health and Reduce Your Cancer Risk" from the [facility].  She was also encouraged to engage in moderate to vigorous exercise for 30 minutes per day most days of the week. We discussed the [employer] fitness program, which is designed for cancer survivors to help them become more physically fit after cancer treatments.  She was instructed to limit her alcohol consumption and continue to abstain from tobacco use.     5. Support services/counseling: It is not uncommon for this period of the patient's cancer care trajectory to be one of many emotions and stressors.  We discussed an opportunity for her to participate in the next session of [facility] ("Finding Your New Normal") support group series designed for patients after they have completed treatment.   Alexandra Powell was encouraged to take advantage of our many other support services programs, support groups, and/or counseling in coping with her new life as a cancer survivor after completing anti-cancer treatment.  She was offered support today through active listening and expressive supportive counseling.  She was given information regarding our available services and encouraged to contact me  with any questions or for help enrolling in any of our support group/programs.    Dispo:   -Return to cancer center in 6 months for f/u with Dr. Lindi Adie  -Mammogram due in 05/2019 -Bone Density in 05/2019 -Follow up with Dr. Donne Hazel in 11/2019 -She is welcome to return back to the Survivorship Clinic at any time; no additional follow-up needed at this time.  -Consider referral back to survivorship as a long-term survivor for continued surveillance  A total of (30) minutes of face-to-face time was spent with this patient with greater than 50% of that time in counseling and care-coordination.   Gardenia Phlegm, NP Survivorship Program [place] [phone]   Note: PRIMARY CARE PROVIDER Deland Pretty, [place] [phone]

## 2018-12-11 ENCOUNTER — Telehealth: Payer: Self-pay | Admitting: Hematology and Oncology

## 2018-12-11 ENCOUNTER — Telehealth: Payer: Self-pay | Admitting: Adult Health

## 2018-12-11 NOTE — Telephone Encounter (Signed)
I left a message regarding schedule  

## 2018-12-14 ENCOUNTER — Encounter: Payer: Medicare Other | Admitting: Adult Health

## 2018-12-17 DIAGNOSIS — Z20828 Contact with and (suspected) exposure to other viral communicable diseases: Secondary | ICD-10-CM | POA: Diagnosis not present

## 2018-12-17 DIAGNOSIS — U071 COVID-19: Secondary | ICD-10-CM | POA: Diagnosis not present

## 2018-12-21 DIAGNOSIS — E039 Hypothyroidism, unspecified: Secondary | ICD-10-CM | POA: Diagnosis not present

## 2018-12-21 DIAGNOSIS — Z23 Encounter for immunization: Secondary | ICD-10-CM | POA: Diagnosis not present

## 2019-04-01 DIAGNOSIS — E039 Hypothyroidism, unspecified: Secondary | ICD-10-CM | POA: Diagnosis not present

## 2019-04-01 DIAGNOSIS — E559 Vitamin D deficiency, unspecified: Secondary | ICD-10-CM | POA: Diagnosis not present

## 2019-04-01 DIAGNOSIS — E785 Hyperlipidemia, unspecified: Secondary | ICD-10-CM | POA: Diagnosis not present

## 2019-04-05 DIAGNOSIS — Z8542 Personal history of malignant neoplasm of other parts of uterus: Secondary | ICD-10-CM | POA: Diagnosis not present

## 2019-04-05 DIAGNOSIS — E039 Hypothyroidism, unspecified: Secondary | ICD-10-CM | POA: Diagnosis not present

## 2019-04-05 DIAGNOSIS — K219 Gastro-esophageal reflux disease without esophagitis: Secondary | ICD-10-CM | POA: Diagnosis not present

## 2019-04-05 DIAGNOSIS — Z7901 Long term (current) use of anticoagulants: Secondary | ICD-10-CM | POA: Diagnosis not present

## 2019-04-05 DIAGNOSIS — E559 Vitamin D deficiency, unspecified: Secondary | ICD-10-CM | POA: Diagnosis not present

## 2019-04-05 DIAGNOSIS — E785 Hyperlipidemia, unspecified: Secondary | ICD-10-CM | POA: Diagnosis not present

## 2019-04-05 DIAGNOSIS — Z8673 Personal history of transient ischemic attack (TIA), and cerebral infarction without residual deficits: Secondary | ICD-10-CM | POA: Diagnosis not present

## 2019-04-05 DIAGNOSIS — Z0000 Encounter for general adult medical examination without abnormal findings: Secondary | ICD-10-CM | POA: Diagnosis not present

## 2019-04-05 DIAGNOSIS — C50911 Malignant neoplasm of unspecified site of right female breast: Secondary | ICD-10-CM | POA: Diagnosis not present

## 2019-04-05 DIAGNOSIS — Z8543 Personal history of malignant neoplasm of ovary: Secondary | ICD-10-CM | POA: Diagnosis not present

## 2019-04-11 DIAGNOSIS — H353111 Nonexudative age-related macular degeneration, right eye, early dry stage: Secondary | ICD-10-CM | POA: Diagnosis not present

## 2019-04-11 DIAGNOSIS — H04123 Dry eye syndrome of bilateral lacrimal glands: Secondary | ICD-10-CM | POA: Diagnosis not present

## 2019-04-11 DIAGNOSIS — H25813 Combined forms of age-related cataract, bilateral: Secondary | ICD-10-CM | POA: Diagnosis not present

## 2019-06-09 NOTE — Progress Notes (Signed)
Patient Care Team: Deland Pretty, MD as PCP - General (Internal Medicine) Rolm Bookbinder, MD as Consulting Physician (General Surgery) Nicholas Lose, MD as Consulting Physician (Hematology and Oncology) Gery Pray, MD as Consulting Physician (Radiation Oncology)  DIAGNOSIS:    ICD-10-CM   1. Malignant neoplasm of upper-outer quadrant of right breast in female, estrogen receptor positive ([place])  C50.411    Z17.0     SUMMARY OF ONCOLOGIC HISTORY: Oncology History  Malignant neoplasm of upper-outer quadrant of right breast in female, estrogen receptor positive ([place])  06/06/2018 Initial Diagnosis   Screening detected right breast mass at 9 o'clock position: 6 mm by ultrasound, axilla negative, grade 3 IDC, ER 100%, PR 95%, Ki-67 80%, HER-2 1+ negative, T1BN0 stage Ia clinical stage   06/13/2018 Cancer Staging   Staging form: Breast, AJCC 8th Edition - Clinical: Stage IA (cT1b, cN0, cM0, G3, ER+, PR+, HER2-) - Signed by Nicholas Lose, MD on 06/13/2018   06/25/2018 Surgery   Right lumpectomy: Grade 3 IDC, 0.9 cm, margins negative, 0/7 lymph nodes negative, ER 100%, PR 95%, HER-2 negative, Ki-67 80%, T1 BN 0 stage Ia   06/25/2018 Cancer Staging   Staging form: Breast, AJCC 8th Edition - Pathologic stage from 06/25/2018: Stage IA (pT1b, pN0, cM0, G3, ER+, PR+, HER2-, Oncotype DX score: 24) - Signed by Gardenia Phlegm, NP on 11/28/2018   07/16/2018 Oncotype testing   Oncotype DX recurrence score 24, risk of recurrence 10% with hormone therapy   07/27/2018 -  Radiation Therapy   Adjuvant XRT   08/2018 -  Anti-estrogen oral therapy   Anastrozole daily     CHIEF COMPLIANT: Follow-up of right breast cancer on anastrozole  INTERVAL HISTORY: Alexandra Powell is a 74 y.o. with above-mentioned history of right breast cancer who underwent a lumpectomy, radiation, and is currently on antiestrogen therapy with anastrozole. She presents to the clinic today for follow-up.  She has been  tolerating anastrozole fairly well.  She continues to have mild hot flashes especially at nighttime as well as joint stiffness.  She thinks that the symptoms were there even before anastrozole and does not think they are any worse than before.  ALLERGIES:  is allergic to erythromycin; keflex [cephalexin]; cephalosporins; and lipitor [atorvastatin].  MEDICATIONS:  Current Outpatient Medications  Medication Sig Dispense Refill  . anastrozole (ARIMIDEX) 1 MG tablet Take 1 tablet (1 mg total) by mouth daily. 90 tablet 3  . cetirizine (ZYRTEC) 5 MG tablet Take 5 mg by mouth daily.    . clopidogrel (PLAVIX) 75 MG tablet Take 1 tablet (75 mg total) by mouth daily. 30 tablet 11  . co-enzyme Q-10 30 MG capsule Take 200 mg by mouth 2 (two) times daily.     Arna Medici 75 MCG tablet TAKE 1 TABLET BY MOUTH IN THE MORNING ON AN EMPTY STOMACH ONCE DAILY FOR 30 DAYS    . magnesium oxide (MAG-OX) 400 MG tablet Take 400 mg by mouth daily.    . Omega-3 Fatty Acids (FISH OIL) 500 MG CAPS Take 750 mg by mouth.     . pantoprazole (PROTONIX) 40 MG tablet Take 40 mg by mouth daily. Delayed release    . rosuvastatin (CRESTOR) 20 MG tablet Take 20 mg by mouth daily.    . Vitamin D, Ergocalciferol, (DRISDOL) 1.25 MG (50000 UT) CAPS capsule Take 50,000 Units by mouth every 7 (seven) days. Pt takes on Friday of each week     No current facility-administered medications for this visit.  PHYSICAL EXAMINATION: ECOG PERFORMANCE STATUS: 1 - Symptomatic but completely ambulatory  Vitals:   06/10/19 1014  BP: 134/75  Pulse: 63  Resp: 18  Temp: 97.8 F (36.6 C)  SpO2: 100%   Filed Weights   06/10/19 1014  Weight: 203 lb 14.4 oz (92.5 kg)    BREAST: No palpable masses or nodules in either right or left breasts. No palpable axillary supraclavicular or infraclavicular adenopathy no breast tenderness or nipple discharge. (exam performed in the presence of a chaperone)  LABORATORY DATA:  I have reviewed the data as  listed CMP Latest Ref Rng & Units 06/13/2018 04/21/2018 04/21/2018  Glucose 70 - 99 mg/dL 100(H) - 102(H)  BUN 8 - 23 mg/dL 17 - 14  Creatinine 0.44 - 1.00 mg/dL 1.01(H) 0.90 0.92  Sodium 135 - 145 mmol/L 142 - 139  Potassium 3.5 - 5.1 mmol/L 4.4 - 3.8  Chloride 98 - 111 mmol/L 104 - 104  CO2 22 - 32 mmol/L 27 - 25  Calcium 8.9 - 10.3 mg/dL 9.6 - 9.0  Total Protein 6.5 - 8.1 g/dL 8.1 - 6.9  Total Bilirubin 0.3 - 1.2 mg/dL 0.5 - 0.5  Alkaline Phos 38 - 126 U/L 115 - 77  AST 15 - 41 U/L 28 - 22  ALT 0 - 44 U/L 30 - 24    Lab Results  Component Value Date   WBC 6.4 06/13/2018   HGB 13.2 06/13/2018   HCT 40.1 06/13/2018   MCV 88.7 06/13/2018   PLT 177 06/13/2018   NEUTROABS 3.6 06/13/2018    ASSESSMENT & PLAN:  Malignant neoplasm of upper-outer quadrant of right breast in female, estrogen receptor positive ([place]) 06/25/2018:Right lumpectomy: Grade 3 IDC, 0.9 cm, margins negative, 0/7 lymph nodes negative, ER 100%, PR 95%, HER-2 negative, Ki-67 80%, T1 BN 0 stage Ia Oncotype DX: 24, risk of recurrence at 9 years 10% Current treatment: Adjuvant radiation therapy started 07/27/2018  Treatment plan: Adjuvant antiestrogen therapy with anastrozole 1 mg daily x5 to 7 years started 08/22/2018 Anastrozole toxicities:  Occasional joint stiffness and hot flashes were there even before starting treatment.  She does not think that these symptoms are any different than before.  Breast cancer surveillance: 1.  Breast exam 06/10/2019: Benign 2.  Mammogram and bone density scheduled for 07/03/2019  Return to clinic in 1 year for follow-up    No orders of the defined types were placed in this encounter.  The patient has a good understanding of the overall plan. she agrees with it. she will call with any problems that may develop before the next visit here.  Total time spent: 20 mins including face to face time and time spent for planning, charting and coordination of care  Nicholas Lose,  MD 06/10/2019  I, Cloyde Reams Dorshimer, am acting as scribe for Dr. Nicholas Lose.  I have reviewed the above documentation for accuracy and completeness, and I agree with the above.

## 2019-06-10 ENCOUNTER — Inpatient Hospital Stay: Payer: Medicare Other | Attending: Hematology and Oncology | Admitting: Hematology and Oncology

## 2019-06-10 ENCOUNTER — Other Ambulatory Visit: Payer: Self-pay

## 2019-06-10 DIAGNOSIS — Z170 Estrogen receptor positive status [ER+]: Secondary | ICD-10-CM | POA: Diagnosis not present

## 2019-06-10 DIAGNOSIS — N951 Menopausal and female climacteric states: Secondary | ICD-10-CM | POA: Insufficient documentation

## 2019-06-10 DIAGNOSIS — M2550 Pain in unspecified joint: Secondary | ICD-10-CM | POA: Diagnosis not present

## 2019-06-10 DIAGNOSIS — C50411 Malignant neoplasm of upper-outer quadrant of right female breast: Secondary | ICD-10-CM | POA: Diagnosis not present

## 2019-06-10 MED ORDER — ACETAMINOPHEN 500 MG PO TABS: 500.0000 mg | ORAL_TABLET | Freq: Two times a day (BID) | ORAL | 0 refills | Status: DC

## 2019-06-10 MED ORDER — ANASTROZOLE 1 MG PO TABS: 1.0000 mg | ORAL_TABLET | Freq: Every day | ORAL | 3 refills | Status: DC

## 2019-06-10 NOTE — Assessment & Plan Note (Signed)
06/25/2018:Right lumpectomy: Grade 3 IDC, 0.9 cm, margins negative, 0/7 lymph nodes negative, ER 100%, PR 95%, HER-2 negative, Ki-67 80%, T1 BN 0 stage Ia Oncotype DX: 24, risk of recurrence at 9 years 10% Current treatment: Adjuvant radiation therapy started 07/27/2018  Treatment plan: Adjuvant antiestrogen therapy with anastrozole 1 mg daily x5 to 7 years started 08/22/2018 Anastrozole toxicities:   Breast cancer surveillance: 1.  Breast exam 06/10/2019: Benign 2.  Mammogram and bone density scheduled for 07/03/2019  Return to clinic in 1 year for follow-up  Return to clinic in 3 months for survivorship care plan visit

## 2019-06-17 ENCOUNTER — Telehealth: Payer: Self-pay | Admitting: Hematology and Oncology

## 2019-06-17 NOTE — Telephone Encounter (Signed)
Scheduled per 03/22 los, patient has been called and notified. ?

## 2019-07-03 ENCOUNTER — Ambulatory Visit
Admission: RE | Admit: 2019-07-03 | Discharge: 2019-07-03 | Disposition: A | Discharge status: Home / Self Care | Payer: Medicare Other | Source: Ambulatory Visit | Attending: Adult Health | Admitting: Adult Health

## 2019-07-03 ENCOUNTER — Other Ambulatory Visit: Payer: Self-pay

## 2019-07-03 ENCOUNTER — Telehealth: Payer: Self-pay

## 2019-07-03 DIAGNOSIS — Z170 Estrogen receptor positive status [ER+]: Secondary | ICD-10-CM

## 2019-07-03 DIAGNOSIS — Z780 Asymptomatic menopausal state: Secondary | ICD-10-CM | POA: Diagnosis not present

## 2019-07-03 DIAGNOSIS — R928 Other abnormal and inconclusive findings on diagnostic imaging of breast: Secondary | ICD-10-CM | POA: Diagnosis not present

## 2019-07-03 DIAGNOSIS — E2839 Other primary ovarian failure: Secondary | ICD-10-CM

## 2019-07-03 DIAGNOSIS — C50411 Malignant neoplasm of upper-outer quadrant of right female breast: Secondary | ICD-10-CM

## 2019-07-03 DIAGNOSIS — M85851 Other specified disorders of bone density and structure, right thigh: Secondary | ICD-10-CM | POA: Diagnosis not present

## 2019-07-03 NOTE — Telephone Encounter (Signed)
TC to pt per Wilber Bihari NP to let her know that with her osteopenia Mendel Ryder  recommend calcium, vitamin d and weight bearing exercises. Patient verbalized understanding.

## 2019-07-04 DIAGNOSIS — Z01419 Encounter for gynecological examination (general) (routine) without abnormal findings: Secondary | ICD-10-CM | POA: Diagnosis not present

## 2019-07-06 DIAGNOSIS — H1033 Unspecified acute conjunctivitis, bilateral: Secondary | ICD-10-CM | POA: Diagnosis not present

## 2019-07-17 DIAGNOSIS — L237 Allergic contact dermatitis due to plants, except food: Secondary | ICD-10-CM | POA: Diagnosis not present

## 2019-07-18 DIAGNOSIS — M6281 Muscle weakness (generalized): Secondary | ICD-10-CM | POA: Diagnosis not present

## 2019-07-18 DIAGNOSIS — N393 Stress incontinence (female) (male): Secondary | ICD-10-CM | POA: Diagnosis not present

## 2019-07-18 DIAGNOSIS — M62838 Other muscle spasm: Secondary | ICD-10-CM | POA: Diagnosis not present

## 2019-07-31 DIAGNOSIS — M6281 Muscle weakness (generalized): Secondary | ICD-10-CM | POA: Diagnosis not present

## 2019-07-31 DIAGNOSIS — N393 Stress incontinence (female) (male): Secondary | ICD-10-CM | POA: Diagnosis not present

## 2019-07-31 DIAGNOSIS — M62838 Other muscle spasm: Secondary | ICD-10-CM | POA: Diagnosis not present

## 2019-08-09 DIAGNOSIS — Z170 Estrogen receptor positive status [ER+]: Secondary | ICD-10-CM | POA: Diagnosis not present

## 2019-08-09 DIAGNOSIS — C50911 Malignant neoplasm of unspecified site of right female breast: Secondary | ICD-10-CM | POA: Diagnosis not present

## 2019-08-12 ENCOUNTER — Telehealth: Payer: Self-pay | Admitting: Hematology and Oncology

## 2019-08-12 NOTE — Telephone Encounter (Signed)
Scheduled appt per 5/24 sch message - mailed reminder letter with appt date and time

## 2019-08-14 DIAGNOSIS — M62838 Other muscle spasm: Secondary | ICD-10-CM | POA: Diagnosis not present

## 2019-08-14 DIAGNOSIS — M6281 Muscle weakness (generalized): Secondary | ICD-10-CM | POA: Diagnosis not present

## 2019-08-14 DIAGNOSIS — N393 Stress incontinence (female) (male): Secondary | ICD-10-CM | POA: Diagnosis not present

## 2019-09-30 DIAGNOSIS — C50911 Malignant neoplasm of unspecified site of right female breast: Secondary | ICD-10-CM | POA: Diagnosis not present

## 2019-09-30 DIAGNOSIS — Z8543 Personal history of malignant neoplasm of ovary: Secondary | ICD-10-CM | POA: Diagnosis not present

## 2019-09-30 DIAGNOSIS — E785 Hyperlipidemia, unspecified: Secondary | ICD-10-CM | POA: Diagnosis not present

## 2019-10-03 DIAGNOSIS — E785 Hyperlipidemia, unspecified: Secondary | ICD-10-CM | POA: Diagnosis not present

## 2019-10-03 DIAGNOSIS — K219 Gastro-esophageal reflux disease without esophagitis: Secondary | ICD-10-CM | POA: Diagnosis not present

## 2019-10-03 DIAGNOSIS — E039 Hypothyroidism, unspecified: Secondary | ICD-10-CM | POA: Diagnosis not present

## 2019-10-03 DIAGNOSIS — Z8543 Personal history of malignant neoplasm of ovary: Secondary | ICD-10-CM | POA: Diagnosis not present

## 2019-10-10 ENCOUNTER — Telehealth: Payer: Self-pay | Admitting: Adult Health

## 2019-10-10 NOTE — Telephone Encounter (Signed)
Rescheduled appointment per provider message. Patient is aware of updated appointment date and time.

## 2020-01-16 DIAGNOSIS — Z23 Encounter for immunization: Secondary | ICD-10-CM | POA: Diagnosis not present

## 2020-02-10 ENCOUNTER — Encounter: Payer: Self-pay | Admitting: Adult Health

## 2020-02-10 ENCOUNTER — Telehealth: Payer: Self-pay | Admitting: Adult Health

## 2020-02-10 ENCOUNTER — Other Ambulatory Visit: Payer: Self-pay

## 2020-02-10 ENCOUNTER — Inpatient Hospital Stay: Payer: Medicare Other | Attending: Adult Health | Admitting: Adult Health

## 2020-02-10 VITALS — BP 130/80 | HR 64 | Temp 98.0°F | Resp 18 | Ht 61.5 in | Wt 222.6 lb

## 2020-02-10 DIAGNOSIS — Z923 Personal history of irradiation: Secondary | ICD-10-CM | POA: Diagnosis not present

## 2020-02-10 DIAGNOSIS — C50411 Malignant neoplasm of upper-outer quadrant of right female breast: Secondary | ICD-10-CM | POA: Insufficient documentation

## 2020-02-10 DIAGNOSIS — M8580 Other specified disorders of bone density and structure, unspecified site: Secondary | ICD-10-CM | POA: Diagnosis not present

## 2020-02-10 DIAGNOSIS — Z170 Estrogen receptor positive status [ER+]: Secondary | ICD-10-CM | POA: Diagnosis not present

## 2020-02-10 NOTE — Assessment & Plan Note (Addendum)
06/25/2018:Right lumpectomy: Grade 3 IDC, 0.9 cm, margins negative, 0/7 lymph nodes negative, ER 100%, PR 95%, HER-2 negative, Ki-67 80%, T1 BN 0 stage Ia Oncotype DX: 24, risk of recurrence at 9 years 10% Current treatment: Adjuvant radiation therapy started 07/27/2018  Treatment plan: Adjuvant antiestrogen therapy with anastrozole 1 mg daily x5 to 7 years started 08/22/2018 Anastrozole toxicities: tolerating well Monitoring bone density closely.  Osteopenia noted on 06/2019 bone density.  Bone health information noted.  No need for bisphosphanate at this point.   Breast cancer surveillance: No sign of recurrence Mammogram due again in 06/2020. Breast exam today benign  We reviewed health maintenance, healthy diet and exercise.   

## 2020-02-10 NOTE — Progress Notes (Signed)
[facility] Follow up:    Deland Pretty, MD [address] [place] [address]   DIAGNOSIS: Cancer Staging Malignant neoplasm of upper-outer quadrant of right breast in female, estrogen receptor positive ([place]) Staging form: Breast, AJCC 8th Edition - Clinical: Stage IA (cT1b, cN0, cM0, G3, ER+, PR+, HER2-) - Signed by Nicholas Lose, MD on 06/13/2018 - Pathologic stage from 06/25/2018: Stage IA (pT1b, pN0, cM0, G3, ER+, PR+, HER2-, Oncotype DX score: 24) - Signed by Gardenia Phlegm, NP on 11/28/2018   SUMMARY OF ONCOLOGIC HISTORY: Oncology History  Malignant neoplasm of upper-outer quadrant of right breast in female, estrogen receptor positive ([place])  06/06/2018 Initial Diagnosis   Screening detected right breast mass at 9 o'clock position: 6 mm by ultrasound, axilla negative, grade 3 IDC, ER 100%, PR 95%, Ki-67 80%, HER-2 1+ negative, T1BN0 stage Ia clinical stage   06/13/2018 Cancer Staging   Staging form: Breast, AJCC 8th Edition - Clinical: Stage IA (cT1b, cN0, cM0, G3, ER+, PR+, HER2-) - Signed by Nicholas Lose, MD on 06/13/2018   06/25/2018 Surgery   Right lumpectomy: Grade 3 IDC, 0.9 cm, margins negative, 0/7 lymph nodes negative, ER 100%, PR 95%, HER-2 negative, Ki-67 80%, T1 BN 0 stage Ia   06/25/2018 Cancer Staging   Staging form: Breast, AJCC 8th Edition - Pathologic stage from 06/25/2018: Stage IA (pT1b, pN0, cM0, G3, ER+, PR+, HER2-, Oncotype DX score: 24) - Signed by Gardenia Phlegm, NP on 11/28/2018   07/16/2018 Oncotype testing   Oncotype DX recurrence score 24, risk of recurrence 10% with hormone therapy   07/27/2018 -  Radiation Therapy   Adjuvant XRT   08/2018 -  Anti-estrogen oral therapy   Anastrozole daily     CURRENT THERAPY:Anastrozole  INTERVAL HISTORY: Alexandra Powell 74 y.o. female returns for evaluation of her ER positive breast cancer.  She is feeling well.  She is taking Anastrozole daily and has some  stiff joints.  She is managing these well.    She underwent a bone density on 07/03/2019 that showed osteopenia with a Tscore -1.7.  Her most recent mammogram was completed on 07/03/2019 and showed no evidence of malignancy and breast density category B.    She exercises regularly and is up to date with seeing her PCP and other health maintenance.   Patient Active Problem List   Diagnosis Date Noted  . Malignant neoplasm of upper-outer quadrant of right breast in female, estrogen receptor positive ([place]) 06/11/2018  . 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 Hypothyroidism (acquired)   . GERD (gastroesophageal reflux disease)   . Lacunar infarct, acute ([place])     is allergic to erythromycin, keflex [cephalexin], cephalosporins, and lipitor [atorvastatin].  MEDICAL HISTORY: Past Medical History:  Diagnosis Date  . Breast cancer ([place]) 2020   Right Breast  . Cancer ([place]) 05/2018    right breast IDC  . GERD (gastroesophageal reflux disease)   . Hypothyroidism (acquired)   . Personal history of radiation therapy 2020   Right Breast Cancer  . Pre-diabetes   . Stroke ([place]) 04/2018   do deficits  . Vitamin D deficiency     SURGICAL HISTORY: Past Surgical History:  Procedure Laterality Date  . ABDOMINAL HYSTERECTOMY    . BREAST LUMPECTOMY Right 06/25/2018  . BREAST LUMPECTOMY WITH RADIOACTIVE SEED AND SENTINEL LYMPH NODE BIOPSY Right 06/25/2018   Procedure: RIGHT BREAST LUMPECTOMY WITH RADIOACTIVE SEED AND RIGHT AXILLARY SENTINEL LYMPH NODE BIOPSY;  Surgeon: Rolm Bookbinder,  MD;  Location: [facility];  Service: General;  Laterality: Right;  . HYSTERECTOMY ABDOMINAL WITH SALPINGO-OOPHORECTOMY     remote ovarian cancer  . TONSILLECTOMY      SOCIAL HISTORY: Social History   Socioeconomic History  . Marital status: Married    Spouse name: Not on file  . Number of children: Not on file  . Years of education: Not on file  . Highest education level: Not on file   Occupational History  . Occupation: retired  Tobacco Use  . Smoking status: Never Smoker  . Smokeless tobacco: Never Used  Vaping Use  . Vaping Use: Never used  Substance and Sexual Activity  . Alcohol use: Yes    Comment: social  . Drug use: No  . Sexual activity: Not on file  Other Topics Concern  . Not on file  Social History Narrative  . Not on file   Social Determinants of Health   Financial Resource Strain:   . Difficulty of Paying Living Expenses: Not on file  Food Insecurity:   . Worried About Charity fundraiser in the Last Year: Not on file  . Ran Out of Food in the Last Year: Not on file  Transportation Needs:   . Lack of Transportation (Medical): Not on file  . Lack of Transportation (Non-Medical): Not on file  Physical Activity:   . Days of Exercise per Week: Not on file  . Minutes of Exercise per Session: Not on file  Stress:   . Feeling of Stress : Not on file  Social Connections:   . Frequency of Communication with Friends and Family: Not on file  . Frequency of Social Gatherings with Friends and Family: Not on file  . Attends Religious Services: Not on file  . Active Member of Clubs or Organizations: Not on file  . Attends Archivist Meetings: Not on file  . Marital Status: Not on file  Intimate Partner Violence:   . Fear of Current or Ex-Partner: Not on file  . Emotionally Abused: Not on file  . Physically Abused: Not on file  . Sexually Abused: Not on file    FAMILY HISTORY: Family History  Problem Relation Age of Onset  . Stroke Mother   . Lung cancer Mother   . Stroke Maternal Grandfather   . Colon cancer Father   . Prostate cancer Father   . Lung cancer Father   . Breast cancer Sister   . Lung cancer Brother   . Lung cancer Maternal Aunt   . Pancreatic cancer Paternal Uncle     Review of Systems  Constitutional: Negative for appetite change, chills, fatigue, fever and unexpected weight change.  HENT:   Negative for  hearing loss, lump/mass and trouble swallowing.   Eyes: Negative for eye problems and icterus.  Respiratory: Negative for chest tightness, cough and shortness of breath.   Cardiovascular: Negative for chest pain, leg swelling and palpitations.  Gastrointestinal: Negative for abdominal distention, abdominal pain, constipation, diarrhea, nausea and vomiting.  Endocrine: Negative for hot flashes.  Genitourinary: Negative for difficulty urinating.   Musculoskeletal: Negative for arthralgias.  Skin: Negative for itching and rash.  Neurological: Negative for dizziness, extremity weakness, headaches and numbness.  Hematological: Negative for adenopathy. Does not bruise/bleed easily.  Psychiatric/Behavioral: Negative for depression. The patient is not nervous/anxious.       PHYSICAL EXAMINATION  ECOG PERFORMANCE STATUS: 1 - Symptomatic but completely ambulatory  Vitals:   02/10/20 0921  BP: 130/80  Pulse: 64  Resp: 18  Temp: 98 F (36.7 C)  SpO2: 99%    Physical Exam Constitutional:      General: She is not in acute distress.    Appearance: Normal appearance. She is not toxic-appearing.  HENT:     Head: Normocephalic and atraumatic.  Eyes:     General: No scleral icterus. Cardiovascular:     Rate and Rhythm: Normal rate and regular rhythm.     Pulses: Normal pulses.     Heart sounds: Normal heart sounds.  Pulmonary:     Effort: Pulmonary effort is normal.     Breath sounds: Normal breath sounds.     Comments: Right breast s/p lumpectomy, no sign of local recurrence, left breast benign  Abdominal:     General: Abdomen is flat. Bowel sounds are normal. There is no distension.     Palpations: Abdomen is soft.     Tenderness: There is no abdominal tenderness.  Musculoskeletal:        General: No swelling.     Cervical back: Neck supple.  Lymphadenopathy:     Cervical: No cervical adenopathy.  Skin:    General: Skin is warm and dry.     Findings: No rash.  Neurological:      General: No focal deficit present.     Mental Status: She is alert.  Psychiatric:        Mood and Affect: Mood normal.        Behavior: Behavior normal.     LABORATORY DATA:  CBC    Component Value Date/Time   WBC 6.4 06/13/2018 1211   WBC 5.7 04/21/2018 1057   RBC 4.52 06/13/2018 1211   HGB 13.2 06/13/2018 1211   HCT 40.1 06/13/2018 1211   PLT 177 06/13/2018 1211   MCV 88.7 06/13/2018 1211   MCH 29.2 06/13/2018 1211   MCHC 32.9 06/13/2018 1211   RDW 13.1 06/13/2018 1211   LYMPHSABS 2.1 06/13/2018 1211   MONOABS 0.5 06/13/2018 1211   EOSABS 0.1 06/13/2018 1211   BASOSABS 0.1 06/13/2018 1211    CMP     Component Value Date/Time   NA 142 06/13/2018 1211   K 4.4 06/13/2018 1211   CL 104 06/13/2018 1211   CO2 27 06/13/2018 1211   GLUCOSE 100 (H) 06/13/2018 1211   BUN 17 06/13/2018 1211   CREATININE 1.01 (H) 06/13/2018 1211   CALCIUM 9.6 06/13/2018 1211   PROT 8.1 06/13/2018 1211   ALBUMIN 3.9 06/13/2018 1211   AST 28 06/13/2018 1211   ALT 30 06/13/2018 1211   ALKPHOS 115 06/13/2018 1211   BILITOT 0.5 06/13/2018 1211   GFRNONAA 56 (L) 06/13/2018 1211   GFRAA >60 06/13/2018 1211     RADIOGRAPHIC STUDIES:  CLINICAL DATA:  Right lumpectomy.  Annual mammography.  EXAM: DIGITAL DIAGNOSTIC BILATERAL MAMMOGRAM WITH CAD AND TOMO  COMPARISON:  Previous exam(s).  ACR Breast Density Category b: There are scattered areas of fibroglandular density.  FINDINGS: The right lumpectomy site appears as expected. No suspicious masses, calcifications, distortion are identified in either breast.  Mammographic images were processed with CAD.  IMPRESSION: No mammographic evidence of malignancy. Right lumpectomy changes appear as expected.  RECOMMENDATION: Annual diagnostic mammography.  I have discussed the findings and recommendations with the patient. If applicable, a reminder letter will be sent to the patient regarding the next appointment.  BI-RADS  CATEGORY  2: Benign.   Electronically Signed   By: Dorise Bullion III M.D   On:  07/03/2019 08:28      ASSESSMENT and PLAN:   Malignant neoplasm of upper-outer quadrant of right breast in female, estrogen receptor positive ([place]) 06/25/2018:Right lumpectomy: Grade 3 IDC, 0.9 cm, margins negative, 0/7 lymph nodes negative, ER 100%, PR 95%, HER-2 negative, Ki-67 80%, T1 BN 0 stage Ia Oncotype DX: 24, risk of recurrence at 9 years 10% Current treatment: Adjuvant radiation therapy started 07/27/2018  Treatment plan: Adjuvant antiestrogen therapy with anastrozole 1 mg daily x5 to 7 years started 08/22/2018 Anastrozole toxicities: tolerating well Monitoring bone density closely.  Osteopenia noted on 06/2019 bone density.  Bone health information noted.  No need for bisphosphanate at this point.   Breast cancer surveillance: No sign of recurrence Mammogram due again in 06/2020. Breast exam today benign  We reviewed health maintenance, healthy diet and exercise.    Dispo:  F/u with Dr. Donne Hazel in 07/2020 Mammogram in 06/2020 F/u with Dr. Lindi Adie in 01/2021  All questions were answered. The patient knows to call the clinic with any problems, questions or concerns. We can certainly see the patient much sooner if necessary.  Total encounter time: 20 minutes*  Wilber Bihari, NP 02/10/20 10:11 AM Medical Oncology and Hematology [facility] [place], [place] [address] Tel. [phone]    Fax. [phone]  *Total Encounter Time as defined by the Centers for Medicare and Medicaid Services includes, in addition to the face-to-face time of a patient visit (documented in the note above) non-face-to-face time: obtaining and reviewing outside history, ordering and reviewing medications, tests or procedures, care coordination (communications with other health care professionals or caregivers) and documentation in the medical record.

## 2020-02-10 NOTE — Telephone Encounter (Signed)
Scheduled appts per 11/22 los. Gave pt a print out of AVS.  

## 2020-02-10 NOTE — Patient Instructions (Signed)

## 2020-02-11 ENCOUNTER — Ambulatory Visit: Payer: Medicare Other | Admitting: Adult Health

## 2020-04-13 DIAGNOSIS — H35363 Drusen (degenerative) of macula, bilateral: Secondary | ICD-10-CM | POA: Diagnosis not present

## 2020-04-13 DIAGNOSIS — H02834 Dermatochalasis of left upper eyelid: Secondary | ICD-10-CM | POA: Diagnosis not present

## 2020-04-13 DIAGNOSIS — H02831 Dermatochalasis of right upper eyelid: Secondary | ICD-10-CM | POA: Diagnosis not present

## 2020-04-13 DIAGNOSIS — H04123 Dry eye syndrome of bilateral lacrimal glands: Secondary | ICD-10-CM | POA: Diagnosis not present

## 2020-04-13 DIAGNOSIS — H25813 Combined forms of age-related cataract, bilateral: Secondary | ICD-10-CM | POA: Diagnosis not present

## 2020-05-06 DIAGNOSIS — E785 Hyperlipidemia, unspecified: Secondary | ICD-10-CM | POA: Diagnosis not present

## 2020-05-06 DIAGNOSIS — E559 Vitamin D deficiency, unspecified: Secondary | ICD-10-CM | POA: Diagnosis not present

## 2020-05-06 DIAGNOSIS — E039 Hypothyroidism, unspecified: Secondary | ICD-10-CM | POA: Diagnosis not present

## 2020-05-06 DIAGNOSIS — M8580 Other specified disorders of bone density and structure, unspecified site: Secondary | ICD-10-CM | POA: Diagnosis not present

## 2020-05-06 DIAGNOSIS — Z0000 Encounter for general adult medical examination without abnormal findings: Secondary | ICD-10-CM | POA: Diagnosis not present

## 2020-05-13 DIAGNOSIS — E039 Hypothyroidism, unspecified: Secondary | ICD-10-CM | POA: Diagnosis not present

## 2020-05-13 DIAGNOSIS — Z0000 Encounter for general adult medical examination without abnormal findings: Secondary | ICD-10-CM | POA: Diagnosis not present

## 2020-05-13 DIAGNOSIS — E785 Hyperlipidemia, unspecified: Secondary | ICD-10-CM | POA: Diagnosis not present

## 2020-05-13 DIAGNOSIS — K219 Gastro-esophageal reflux disease without esophagitis: Secondary | ICD-10-CM | POA: Diagnosis not present

## 2020-05-13 DIAGNOSIS — Z8543 Personal history of malignant neoplasm of ovary: Secondary | ICD-10-CM | POA: Diagnosis not present

## 2020-05-29 ENCOUNTER — Other Ambulatory Visit: Payer: Self-pay | Admitting: Family Medicine

## 2020-05-29 ENCOUNTER — Other Ambulatory Visit: Payer: Self-pay | Admitting: Obstetrics and Gynecology

## 2020-05-29 DIAGNOSIS — Z1231 Encounter for screening mammogram for malignant neoplasm of breast: Secondary | ICD-10-CM

## 2020-05-29 DIAGNOSIS — Z853 Personal history of malignant neoplasm of breast: Secondary | ICD-10-CM

## 2020-05-29 DIAGNOSIS — Z9889 Other specified postprocedural states: Secondary | ICD-10-CM

## 2020-06-05 ENCOUNTER — Telehealth: Payer: Self-pay | Admitting: Hematology and Oncology

## 2020-06-05 NOTE — Telephone Encounter (Signed)
Rescheduled 03/22 appointment to 03/24 per patient's request.

## 2020-06-09 ENCOUNTER — Ambulatory Visit: Payer: Medicare Other | Admitting: Hematology and Oncology

## 2020-06-10 NOTE — Progress Notes (Signed)
Patient Care Team: Deland Pretty, MD as PCP - General (Internal Medicine) Rolm Bookbinder, MD as Consulting Physician (General Surgery) Nicholas Lose, MD as Consulting Physician (Hematology and Oncology) Gery Pray, MD as Consulting Physician (Radiation Oncology)  DIAGNOSIS:    ICD-10-CM   1. Malignant neoplasm of upper-outer quadrant of right breast in female, estrogen receptor positive ([place])  C50.411    Z17.0     SUMMARY OF ONCOLOGIC HISTORY: Oncology History  Malignant neoplasm of upper-outer quadrant of right breast in female, estrogen receptor positive ([place])  06/06/2018 Initial Diagnosis   Screening detected right breast mass at 9 o'clock position: 6 mm by ultrasound, axilla negative, grade 3 IDC, ER 100%, PR 95%, Ki-67 80%, HER-2 1+ negative, T1BN0 stage Ia clinical stage   06/13/2018 Cancer Staging   Staging form: Breast, AJCC 8th Edition - Clinical: Stage IA (cT1b, cN0, cM0, G3, ER+, PR+, HER2-) - Signed by Nicholas Lose, MD on 06/13/2018   06/25/2018 Surgery   Right lumpectomy: Grade 3 IDC, 0.9 cm, margins negative, 0/7 lymph nodes negative, ER 100%, PR 95%, HER-2 negative, Ki-67 80%, T1 BN 0 stage Ia   06/25/2018 Cancer Staging   Staging form: Breast, AJCC 8th Edition - Pathologic stage from 06/25/2018: Stage IA (pT1b, pN0, cM0, G3, ER+, PR+, HER2-, Oncotype DX score: 24) - Signed by [place] Phlegm, NP on 11/28/2018   07/16/2018 Oncotype testing   Oncotype DX recurrence score 24, risk of recurrence 10% with hormone therapy   07/27/2018 -  Radiation Therapy   Adjuvant XRT   08/2018 -  Anti-estrogen oral therapy   Anastrozole daily     CHIEF COMPLIANT: Follow-up of right breast cancer on anastrozole  INTERVAL HISTORY: Alexandra Powell is a 75 y.o. with above-mentioned history of right breast cancer who underwent a lumpectomy, radiation, and is currently on antiestrogen therapy with anastrozole. Mammogram on 07/03/19 showed no evidence of malignancy bilaterally.  She presents to the clinic today for follow-up.   ALLERGIES:  is allergic to erythromycin, keflex [cephalexin], cephalosporins, and lipitor [atorvastatin].  MEDICATIONS:  Current Outpatient Medications  Medication Sig Dispense Refill  . acetaminophen (TYLENOL) 500 MG tablet Take 1 tablet (500 mg total) by mouth in the morning and at bedtime. 30 tablet 0  . anastrozole (ARIMIDEX) 1 MG tablet Take 1 tablet (1 mg total) by mouth daily. 90 tablet 3  . calcium carbonate (TUMS - DOSED IN MG ELEMENTAL CALCIUM) 500 MG chewable tablet Chew 1 tablet by mouth daily.    . cetirizine (ZYRTEC) 5 MG tablet Take 5 mg by mouth daily.    . clopidogrel (PLAVIX) 75 MG tablet Take 1 tablet (75 mg total) by mouth daily. 30 tablet 11  . co-enzyme Q-10 30 MG capsule Take 200 mg by mouth 2 (two) times daily.     . EUTHYROX 75 MCG tablet TAKE 1 TABLET BY MOUTH IN THE MORNING ON AN EMPTY STOMACH ONCE DAILY FOR 30 DAYS    . Fe Bisgly-Vit C-Vit B12-FA 28-60-0.008-0.4 MG CAPS Take 1 tablet by mouth daily.    . magnesium oxide (MAG-OX) 400 MG tablet Take 400 mg by mouth daily.    . Omega-3 Fatty Acids (FISH OIL) 500 MG CAPS Take 750 mg by mouth.     . pantoprazole (PROTONIX) 40 MG tablet Take 40 mg by mouth daily. Delayed release    . rosuvastatin (CRESTOR) 20 MG tablet Take 20 mg by mouth daily.    . Vitamin D, Ergocalciferol, (DRISDOL) 1.25 MG (50000 UT) CAPS  capsule Take 50,000 Units by mouth every 7 (seven) days. Pt takes on Friday of each week     No current facility-administered medications for this visit.    PHYSICAL EXAMINATION: ECOG PERFORMANCE STATUS: 1 - Symptomatic but completely ambulatory  There were no vitals filed for this visit. There were no vitals filed for this visit.  BREAST: No palpable masses or nodules in either right or left breasts. No palpable axillary supraclavicular or infraclavicular adenopathy no breast tenderness or nipple discharge. (exam performed in the presence of a  chaperone)  LABORATORY DATA:  I have reviewed the data as listed CMP Latest Ref Rng & Units 06/13/2018 04/21/2018 04/21/2018  Glucose 70 - 99 mg/dL 100(H) - 102(H)  BUN 8 - 23 mg/dL 17 - 14  Creatinine 0.44 - 1.00 mg/dL 1.01(H) 0.90 0.92  Sodium 135 - 145 mmol/L 142 - 139  Potassium 3.5 - 5.1 mmol/L 4.4 - 3.8  Chloride 98 - 111 mmol/L 104 - 104  CO2 22 - 32 mmol/L 27 - 25  Calcium 8.9 - 10.3 mg/dL 9.6 - 9.0  Total Protein 6.5 - 8.1 g/dL 8.1 - 6.9  Total Bilirubin 0.3 - 1.2 mg/dL 0.5 - 0.5  Alkaline Phos 38 - 126 U/L 115 - 77  AST 15 - 41 U/L 28 - 22  ALT 0 - 44 U/L 30 - 24    Lab Results  Component Value Date   WBC 6.4 06/13/2018   HGB 13.2 06/13/2018   HCT 40.1 06/13/2018   MCV 88.7 06/13/2018   PLT 177 06/13/2018   NEUTROABS 3.6 06/13/2018    ASSESSMENT & PLAN:  Malignant neoplasm of upper-outer quadrant of right breast in female, estrogen receptor positive ([place]) 06/25/2018:Right lumpectomy: Grade 3 IDC, 0.9 cm, margins negative, 0/7 lymph nodes negative, ER 100%, PR 95%, HER-2 negative, Ki-67 80%, T1 BN 0 stage Ia Oncotype DX: 24, risk of recurrence at 9 years 10% Current treatment: Adjuvant radiation therapy started 07/27/2018  Treatment plan: Adjuvant antiestrogen therapy with anastrozole 1 mg daily x5 to 7 years started 08/22/2018  Anastrozole toxicities: tolerating well Monitoring bone density closely.  Osteopenia noted on 06/2019 bone density.  Breast cancer surveillance: No sign of recurrence Mammogram due again in 06/2020. Breast exam today benign  Return to clinic in 1 year for follow-up No orders of the defined types were placed in this encounter.  The patient has a good understanding of the overall plan. she agrees with it. she will call with any problems that may develop before the next visit here.  Total time spent: 20 mins including face to face time and time spent for planning, charting and coordination of care  Rulon Eisenmenger, MD, MPH 06/11/2020  I, Cloyde Reams  Dorshimer, am acting as scribe for Dr. Nicholas Lose.  I have reviewed the above documentation for accuracy and completeness, and I agree with the above.

## 2020-06-10 NOTE — Assessment & Plan Note (Signed)
06/25/2018:Right lumpectomy: Grade 3 IDC, 0.9 cm, margins negative, 0/7 lymph nodes negative, ER 100%, PR 95%, HER-2 negative, Ki-67 80%, T1 BN 0 stage Ia Oncotype DX: 24, risk of recurrence at 9 years 10% Current treatment: Adjuvant radiation therapy started 07/27/2018  Treatment plan: Adjuvant antiestrogen therapy with anastrozole 1 mg daily x5 to 7 years started 08/22/2018 Anastrozole toxicities: tolerating well Monitoring bone density closely.  Osteopenia noted on 06/2019 bone density.  Bone health information noted.  No need for bisphosphanate at this point.   Breast cancer surveillance: No sign of recurrence Mammogram due again in 06/2020. Breast exam today benign

## 2020-06-11 ENCOUNTER — Telehealth: Payer: Self-pay | Admitting: Hematology and Oncology

## 2020-06-11 ENCOUNTER — Other Ambulatory Visit: Payer: Self-pay

## 2020-06-11 ENCOUNTER — Inpatient Hospital Stay: Payer: Medicare Other | Attending: Hematology and Oncology | Admitting: Hematology and Oncology

## 2020-06-11 DIAGNOSIS — M8580 Other specified disorders of bone density and structure, unspecified site: Secondary | ICD-10-CM | POA: Insufficient documentation

## 2020-06-11 DIAGNOSIS — Z170 Estrogen receptor positive status [ER+]: Secondary | ICD-10-CM | POA: Insufficient documentation

## 2020-06-11 DIAGNOSIS — C50411 Malignant neoplasm of upper-outer quadrant of right female breast: Secondary | ICD-10-CM | POA: Insufficient documentation

## 2020-06-11 DIAGNOSIS — Z923 Personal history of irradiation: Secondary | ICD-10-CM | POA: Diagnosis not present

## 2020-06-11 MED ORDER — KETOCONAZOLE & MICONAZOLE 2 & 2 % EX THPK: 1.0000 "application " | PACK | Freq: Every day | CUTANEOUS | 3 refills | Status: DC | PRN

## 2020-06-11 MED ORDER — CETIRIZINE HCL 10 MG PO TABS: 10.0000 mg | ORAL_TABLET | Freq: Every day | ORAL | Status: AC

## 2020-06-11 MED ORDER — ANASTROZOLE 1 MG PO TABS: 1.0000 mg | ORAL_TABLET | Freq: Every day | ORAL | 3 refills | Status: DC

## 2020-06-11 MED ORDER — ACETAMINOPHEN 500 MG PO TABS: 1000.0000 mg | ORAL_TABLET | Freq: Two times a day (BID) | ORAL | 0 refills | Status: AC

## 2020-06-11 NOTE — Telephone Encounter (Signed)
Scheduled appts per 3/24 los. Pt aware.

## 2020-06-12 ENCOUNTER — Other Ambulatory Visit: Payer: Self-pay | Admitting: *Deleted

## 2020-06-12 ENCOUNTER — Other Ambulatory Visit: Payer: Self-pay | Admitting: Hematology and Oncology

## 2020-06-12 MED ORDER — KETOCONAZOLE & MICONAZOLE 2 & 2 % EX THPK: 1.0000 "application " | PACK | Freq: Every day | CUTANEOUS | 3 refills | Status: DC | PRN

## 2020-06-15 ENCOUNTER — Other Ambulatory Visit: Payer: Self-pay | Admitting: *Deleted

## 2020-06-15 MED ORDER — KETOCONAZOLE 2 % EX CREA: 1.0000 "application " | TOPICAL_CREAM | Freq: Every day | CUTANEOUS | 1 refills | Status: DC

## 2020-06-15 NOTE — Progress Notes (Signed)
Received fax from pharmacy stating Ketoconazole and miconazole cream can not be compounded.  Per MD pt to be prescribed Ketoconazole 2% cream only.  Prescription sent to pharmacy on file.

## 2020-07-08 ENCOUNTER — Other Ambulatory Visit: Payer: Self-pay | Admitting: Hematology and Oncology

## 2020-07-08 DIAGNOSIS — Z853 Personal history of malignant neoplasm of breast: Secondary | ICD-10-CM

## 2020-07-08 DIAGNOSIS — Z9889 Other specified postprocedural states: Secondary | ICD-10-CM

## 2020-07-13 ENCOUNTER — Other Ambulatory Visit: Payer: Self-pay

## 2020-07-13 ENCOUNTER — Ambulatory Visit
Admission: RE | Admit: 2020-07-13 | Discharge: 2020-07-13 | Disposition: A | Discharge status: Home / Self Care | Payer: Medicare Other | Source: Ambulatory Visit | Attending: Obstetrics and Gynecology | Admitting: Obstetrics and Gynecology

## 2020-07-13 DIAGNOSIS — R922 Inconclusive mammogram: Secondary | ICD-10-CM | POA: Diagnosis not present

## 2020-07-13 DIAGNOSIS — Z853 Personal history of malignant neoplasm of breast: Secondary | ICD-10-CM

## 2020-07-13 DIAGNOSIS — Z9889 Other specified postprocedural states: Secondary | ICD-10-CM

## 2020-07-20 DIAGNOSIS — C50911 Malignant neoplasm of unspecified site of right female breast: Secondary | ICD-10-CM | POA: Diagnosis not present

## 2020-07-20 DIAGNOSIS — Z170 Estrogen receptor positive status [ER+]: Secondary | ICD-10-CM | POA: Diagnosis not present

## 2020-08-06 DIAGNOSIS — S61213A Laceration without foreign body of left middle finger without damage to nail, initial encounter: Secondary | ICD-10-CM | POA: Diagnosis not present

## 2020-08-06 DIAGNOSIS — S6742XA Crushing injury of left wrist and hand, initial encounter: Secondary | ICD-10-CM | POA: Diagnosis not present

## 2020-08-06 DIAGNOSIS — S61211A Laceration without foreign body of left index finger without damage to nail, initial encounter: Secondary | ICD-10-CM | POA: Diagnosis not present

## 2020-08-13 DIAGNOSIS — S6742XD Crushing injury of left wrist and hand, subsequent encounter: Secondary | ICD-10-CM | POA: Diagnosis not present

## 2020-08-13 DIAGNOSIS — S61211D Laceration without foreign body of left index finger without damage to nail, subsequent encounter: Secondary | ICD-10-CM | POA: Diagnosis not present

## 2020-08-13 DIAGNOSIS — S61213D Laceration without foreign body of left middle finger without damage to nail, subsequent encounter: Secondary | ICD-10-CM | POA: Diagnosis not present

## 2020-08-28 ENCOUNTER — Other Ambulatory Visit: Payer: Self-pay | Admitting: Hematology and Oncology

## 2020-09-25 DIAGNOSIS — J3489 Other specified disorders of nose and nasal sinuses: Secondary | ICD-10-CM | POA: Diagnosis not present

## 2020-09-25 DIAGNOSIS — R059 Cough, unspecified: Secondary | ICD-10-CM | POA: Diagnosis not present

## 2020-09-25 DIAGNOSIS — R509 Fever, unspecified: Secondary | ICD-10-CM | POA: Diagnosis not present

## 2020-11-11 DIAGNOSIS — Z8543 Personal history of malignant neoplasm of ovary: Secondary | ICD-10-CM | POA: Diagnosis not present

## 2020-11-11 DIAGNOSIS — E785 Hyperlipidemia, unspecified: Secondary | ICD-10-CM | POA: Diagnosis not present

## 2020-11-27 DIAGNOSIS — Z23 Encounter for immunization: Secondary | ICD-10-CM | POA: Diagnosis not present

## 2021-02-09 ENCOUNTER — Other Ambulatory Visit: Payer: Self-pay | Admitting: Hematology and Oncology

## 2021-02-14 NOTE — Progress Notes (Signed)
Patient Care Team: Deland Pretty, MD as PCP - General (Internal Medicine) Rolm Bookbinder, MD as Consulting Physician (General Surgery) Nicholas Lose, MD as Consulting Physician (Hematology and Oncology) Gery Pray, MD as Consulting Physician (Radiation Oncology)  DIAGNOSIS:    ICD-10-CM   1. Malignant neoplasm of upper-outer quadrant of right breast in female, estrogen receptor positive ([place])  C50.411    Z17.0       SUMMARY OF ONCOLOGIC HISTORY: Oncology History  Malignant neoplasm of upper-outer quadrant of right breast in female, estrogen receptor positive ([place])  06/06/2018 Initial Diagnosis   Screening detected right breast mass at 9 o'clock position: 6 mm by ultrasound, axilla negative, grade 3 IDC, ER 100%, PR 95%, Ki-67 80%, HER-2 1+ negative, T1BN0 stage Ia clinical stage   06/13/2018 Cancer Staging   Staging form: Breast, AJCC 8th Edition - Clinical: Stage IA (cT1b, cN0, cM0, G3, ER+, PR+, HER2-) - Signed by Nicholas Lose, MD on 06/13/2018    06/25/2018 Surgery   Right lumpectomy: Grade 3 IDC, 0.9 cm, margins negative, 0/7 lymph nodes negative, ER 100%, PR 95%, HER-2 negative, Ki-67 80%, T1 BN 0 stage Ia   06/25/2018 Cancer Staging   Staging form: Breast, AJCC 8th Edition - Pathologic stage from 06/25/2018: Stage IA (pT1b, pN0, cM0, G3, ER+, PR+, HER2-, Oncotype DX score: 24) - Signed by Gardenia Phlegm, NP on 11/28/2018    07/16/2018 Oncotype testing   Oncotype DX recurrence score 24, risk of recurrence 10% with hormone therapy   07/27/2018 -  Radiation Therapy   Adjuvant XRT   08/2018 -  Anti-estrogen oral therapy   Anastrozole daily     CHIEF COMPLIANT: Follow-up of right breast cancer on anastrozole  INTERVAL HISTORY: Alexandra Powell is a 75 y.o. with above-mentioned history of right breast cancer who underwent a lumpectomy, radiation, and is currently on antiestrogen therapy with anastrozole. Mammogram on 07/13/2020 showed no evidence of malignancy  bilaterally. She presents to the clinic today for follow-up.  Her major complaints are fatigue and weight gain.  She just recently celebrated her 75th birthday.  ALLERGIES:  is allergic to erythromycin, keflex [cephalexin], cephalosporins, and lipitor [atorvastatin].  MEDICATIONS:  Current Outpatient Medications  Medication Sig Dispense Refill   acetaminophen (TYLENOL) 500 MG tablet Take 2 tablets (1,000 mg total) by mouth in the morning and at bedtime. 30 tablet 0   anastrozole (ARIMIDEX) 1 MG tablet Take 1 tablet by mouth once daily 90 tablet 3   calcium carbonate (TUMS - DOSED IN MG ELEMENTAL CALCIUM) 500 MG chewable tablet Chew 1 tablet by mouth daily.     cetirizine (KLS ALLER-TEC) 10 MG tablet Take 1 tablet (10 mg total) by mouth daily.     clopidogrel (PLAVIX) 75 MG tablet Take 1 tablet (75 mg total) by mouth daily. 30 tablet 11   co-enzyme Q-10 30 MG capsule Take 200 mg by mouth 2 (two) times daily.      EUTHYROX 75 MCG tablet TAKE 1 TABLET BY MOUTH IN THE MORNING ON AN EMPTY STOMACH ONCE DAILY FOR 30 DAYS     Fe Bisgly-Vit C-Vit B12-FA 28-60-0.008-0.4 MG CAPS Take 1 tablet by mouth daily.     ketoconazole (NIZORAL) 2 % cream APPLY 1 APPLICATION TOPICALLY DAILY 30 g 1   magnesium oxide (MAG-OX) 400 MG tablet Take 400 mg by mouth daily.     Omega-3 Fatty Acids (FISH OIL) 500 MG CAPS Take 750 mg by mouth.      pantoprazole (PROTONIX) 40 MG  tablet Take 40 mg by mouth daily. Delayed release     rosuvastatin (CRESTOR) 20 MG tablet Take 20 mg by mouth daily.     Vitamin D, Ergocalciferol, (DRISDOL) 1.25 MG (50000 UT) CAPS capsule Take 50,000 Units by mouth every 7 (seven) days. Pt takes on Friday of each week     No current facility-administered medications for this visit.    PHYSICAL EXAMINATION: ECOG PERFORMANCE STATUS: 1 - Symptomatic but completely ambulatory  Vitals:   02/15/21 0845  BP: 138/62  Pulse: 76  Resp: 18  Temp: 97.8 F (36.6 C)  SpO2: 97%   Filed Weights    02/15/21 0845  Weight: 225 lb 8 oz (102.3 kg)    BREAST: No palpable masses or nodules in either right or left breasts. No palpable axillary supraclavicular or infraclavicular adenopathy no breast tenderness or nipple discharge. (exam performed in the presence of a chaperone)  LABORATORY DATA:  I have reviewed the data as listed CMP Latest Ref Rng & Units 06/13/2018 04/21/2018 04/21/2018  Glucose 70 - 99 mg/dL 100(H) - 102(H)  BUN 8 - 23 mg/dL 17 - 14  Creatinine 0.44 - 1.00 mg/dL 1.01(H) 0.90 0.92  Sodium 135 - 145 mmol/L 142 - 139  Potassium 3.5 - 5.1 mmol/L 4.4 - 3.8  Chloride 98 - 111 mmol/L 104 - 104  CO2 22 - 32 mmol/L 27 - 25  Calcium 8.9 - 10.3 mg/dL 9.6 - 9.0  Total Protein 6.5 - 8.1 g/dL 8.1 - 6.9  Total Bilirubin 0.3 - 1.2 mg/dL 0.5 - 0.5  Alkaline Phos 38 - 126 U/L 115 - 77  AST 15 - 41 U/L 28 - 22  ALT 0 - 44 U/L 30 - 24    Lab Results  Component Value Date   WBC 6.4 06/13/2018   HGB 13.2 06/13/2018   HCT 40.1 06/13/2018   MCV 88.7 06/13/2018   PLT 177 06/13/2018   NEUTROABS 3.6 06/13/2018    ASSESSMENT & PLAN:  Malignant neoplasm of upper-outer quadrant of right breast in female, estrogen receptor positive ([place]) 06/25/2018:Right lumpectomy: Grade 3 IDC, 0.9 cm, margins negative, 0/7 lymph nodes negative, ER 100%, PR 95%, HER-2 negative, Ki-67 80%, T1 BN 0 stage Ia Oncotype DX: 24, risk of recurrence at 9 years 10% Current treatment: Adjuvant radiation therapy started 07/27/2018   Treatment plan: Adjuvant antiestrogen therapy with anastrozole 1 mg daily x5 to 7 years started 08/22/2018   Anastrozole toxicities: tolerating well.  Complains of fatigue and weight gain. Monitoring bone density closely.  Osteopenia noted on 06/2019 bone density.   Breast cancer surveillance: No sign of recurrence Mammogram 07/13/2020: Benign breast density category B Breast exam 02/07/2021: Benign   Return to clinic in 1 year for follow-up    No orders of the defined types were placed  in this encounter.  The patient has a good understanding of the overall plan. she agrees with it. she will call with any problems that may develop before the next visit here.  Total time spent: 20 mins including face to face time and time spent for planning, charting and coordination of care  Rulon Eisenmenger, MD, MPH 02/15/2021  I, Thana Ates, am acting as scribe for Dr. Nicholas Lose.  I have reviewed the above documentation for accuracy and completeness, and I agree with the above.

## 2021-02-15 ENCOUNTER — Inpatient Hospital Stay: Payer: Medicare Other | Attending: Hematology and Oncology | Admitting: Hematology and Oncology

## 2021-02-15 ENCOUNTER — Other Ambulatory Visit: Payer: Self-pay

## 2021-02-15 DIAGNOSIS — Z923 Personal history of irradiation: Secondary | ICD-10-CM | POA: Insufficient documentation

## 2021-02-15 DIAGNOSIS — R5383 Other fatigue: Secondary | ICD-10-CM | POA: Insufficient documentation

## 2021-02-15 DIAGNOSIS — C50411 Malignant neoplasm of upper-outer quadrant of right female breast: Secondary | ICD-10-CM | POA: Diagnosis not present

## 2021-02-15 DIAGNOSIS — R635 Abnormal weight gain: Secondary | ICD-10-CM | POA: Insufficient documentation

## 2021-02-15 DIAGNOSIS — Z170 Estrogen receptor positive status [ER+]: Secondary | ICD-10-CM | POA: Insufficient documentation

## 2021-02-15 DIAGNOSIS — Z79811 Long term (current) use of aromatase inhibitors: Secondary | ICD-10-CM | POA: Insufficient documentation

## 2021-02-15 NOTE — Assessment & Plan Note (Addendum)
06/25/2018:Right lumpectomy: Grade 3 IDC, 0.9 cm, margins negative, 0/7 lymph nodes negative, ER 100%, PR 95%, HER-2 negative, Ki-67 80%, T1 BN 0 stage Ia Oncotype DX: 24, risk of recurrence at 9 years 10% Current treatment: Adjuvant radiation therapy started 07/27/2018  Treatment plan: Adjuvant antiestrogen therapy with anastrozole 1 mg daily x5 to 7 years started 08/22/2018  Anastrozole toxicities:tolerating well Monitoring bone density closely. Osteopenia noted on 06/2019 bone density.  Breast cancer surveillance: No sign of recurrence Mammogram 07/13/2020: Benign breast density category B Breast exam 02/07/2021: Benign  Return to clinic in 1 year for follow-up

## 2021-03-10 IMAGING — MG MM PLC BREAST LOC DEV 1ST LESION INC*R*
8 series · 8 of 8 positions shown · non-contrast
Comparison: Previous exam(s).

CLINICAL DATA: Patient with RIGHT breast cancer scheduled for
lumpectomy requiring preoperative radioactive seed localization.

EXAM:
MAMMOGRAPHIC GUIDED RADIOACTIVE SEED LOCALIZATION OF THE RIGHT
BREAST

[R CC (1 of 5)]
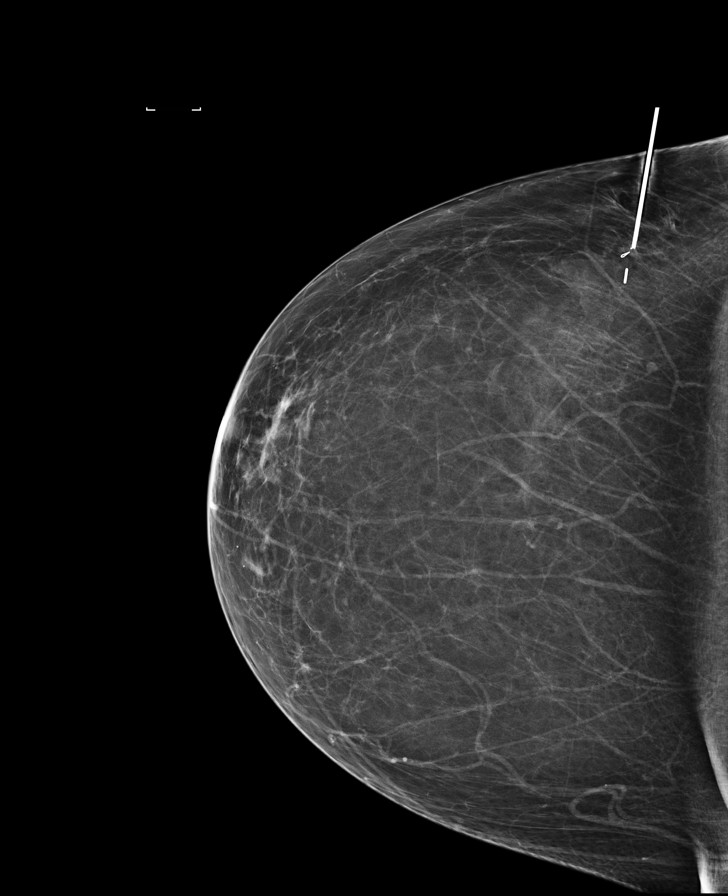

[R LM (1 of 2)]
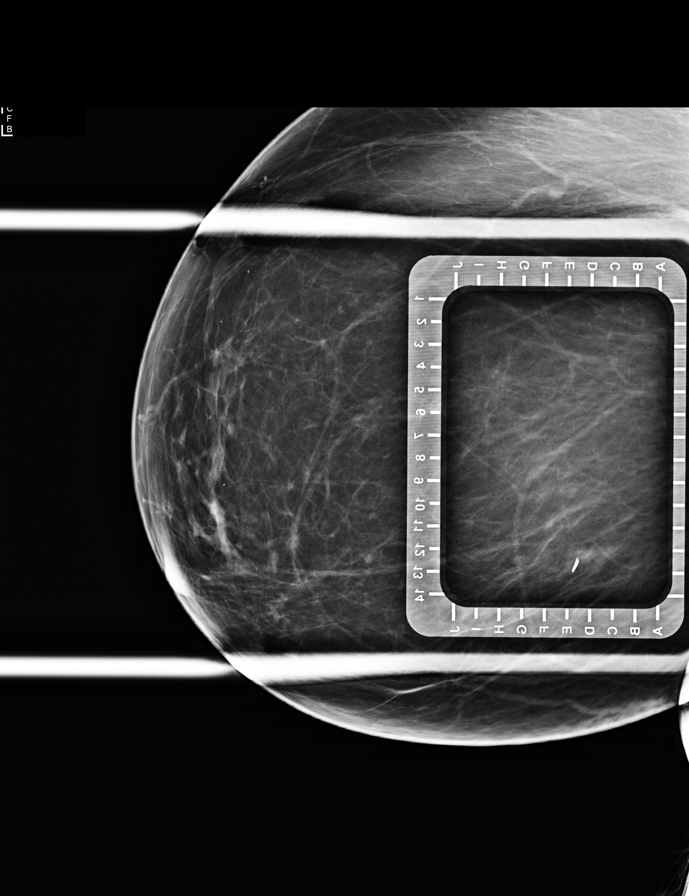

[R CC (2 of 5)]
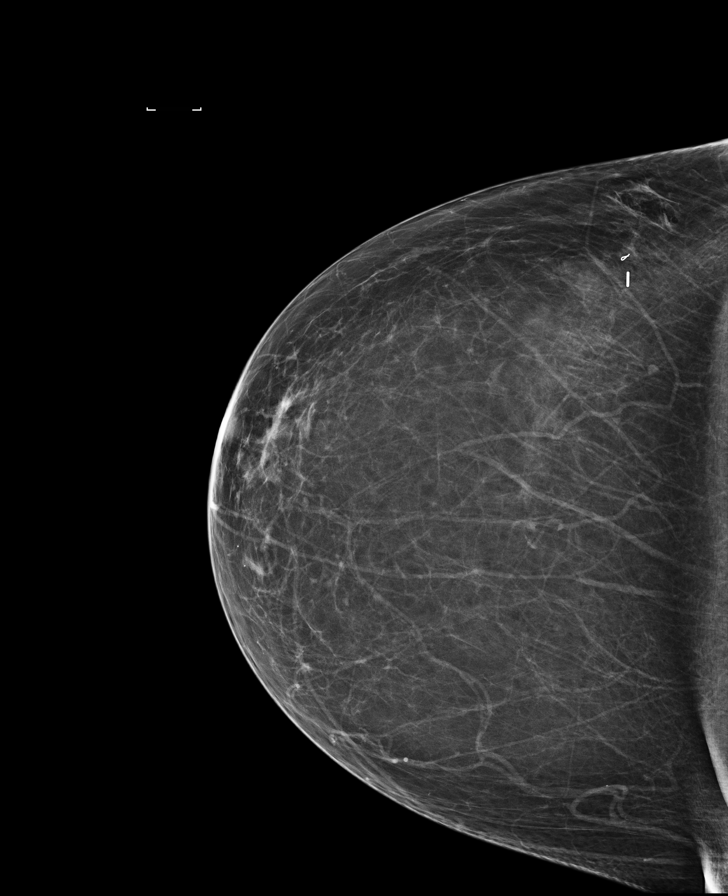

[R LM (2 of 2)]
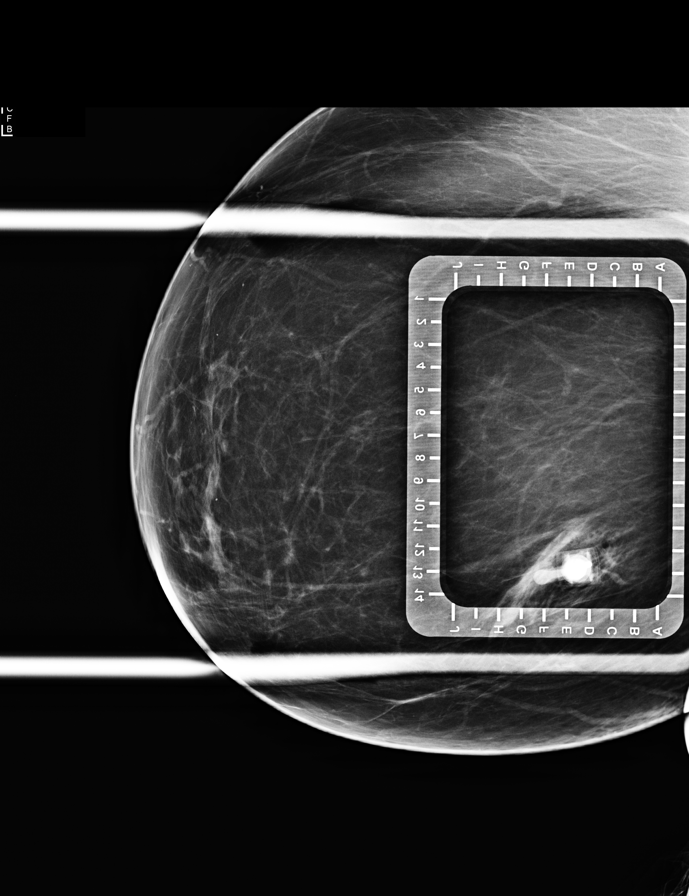

[R ML]
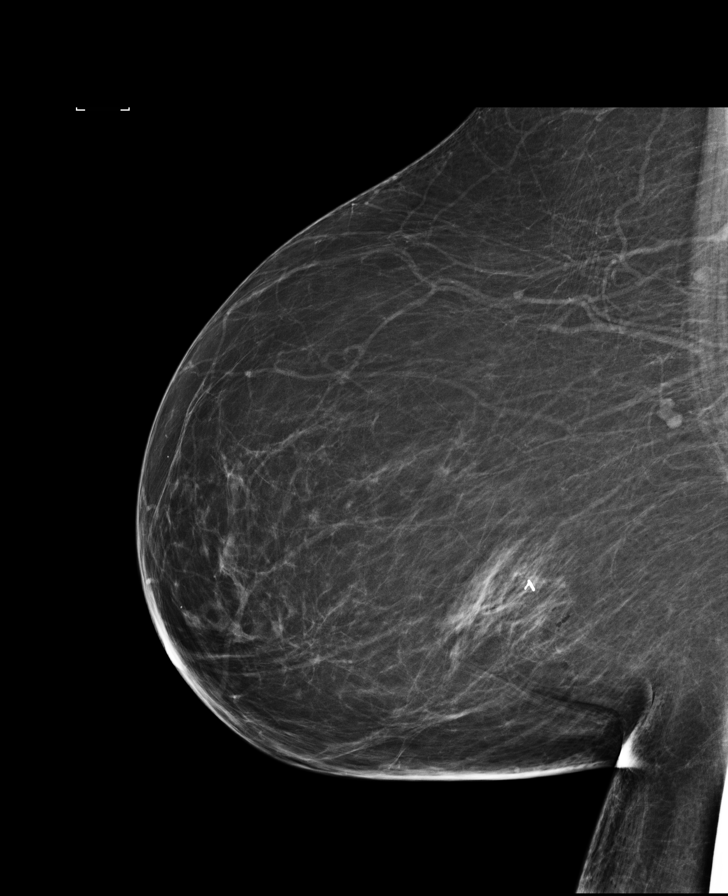

[R CC (3 of 5)]
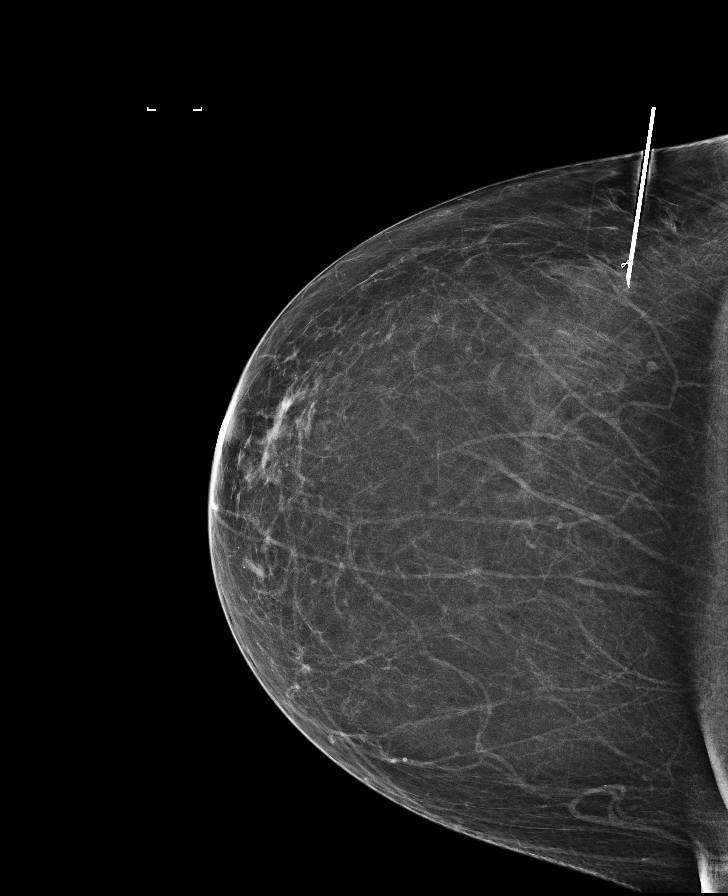

[R CC (4 of 5)]
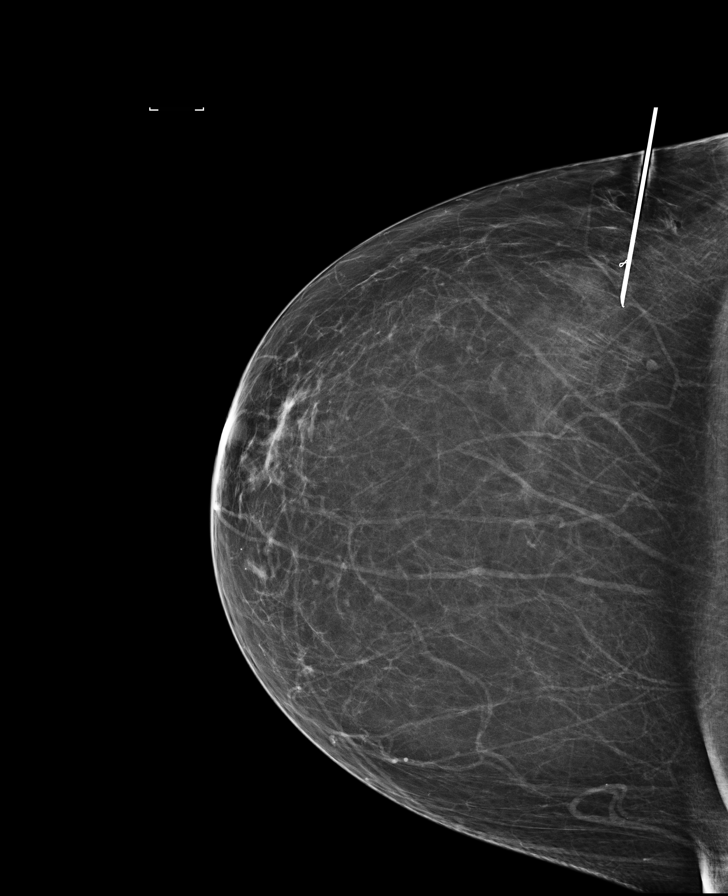

[R CC (5 of 5)]
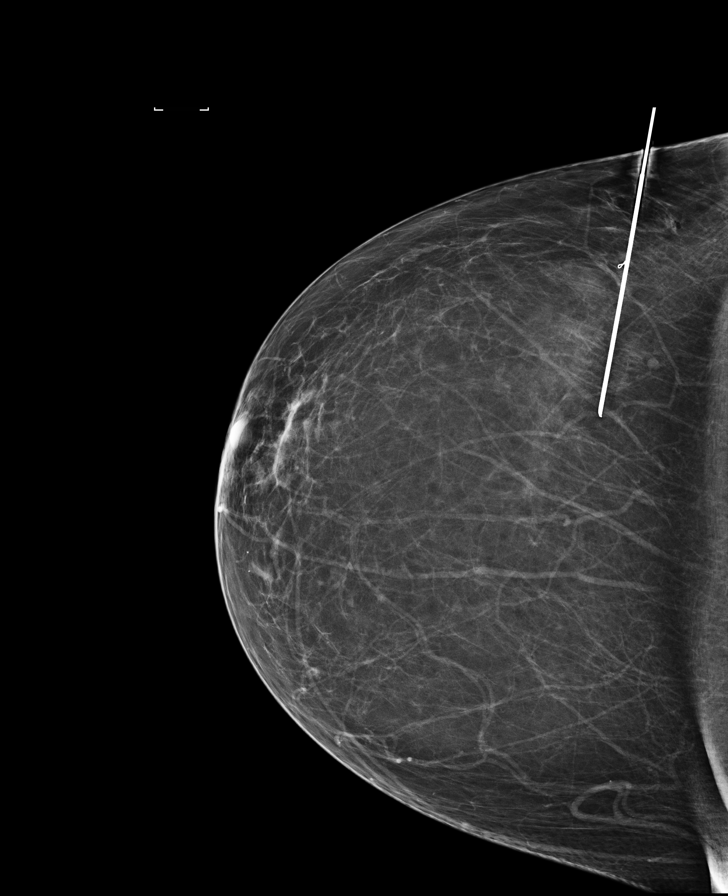

[8 of 8 positions shown; findings below may reference images not displayed]

FINDINGS: Patient presents for radioactive seed localization prior to
lumpectomy. I met with the patient and we discussed the procedure of
seed localization including benefits and alternatives. We discussed
the high likelihood of a successful procedure. We discussed the
risks of the procedure including infection, bleeding, tissue injury
and further surgery. We discussed the low dose of radioactivity
involved in the procedure. Informed, written consent was given.

The usual time-out protocol was performed immediately prior to the
procedure.

Using mammographic guidance, sterile technique, 1% lidocaine and an
1-0ZC radioactive seed, ribbon shaped clip within the outer RIGHT
breast was localized using a lateral approach. The follow-up
mammogram images confirm the seed in the expected location and were
marked for Dr. Hugh.

Follow-up survey of the patient confirms presence of the radioactive
seed.

Order number of 1-0ZC seed:  [id number].

Total activity:  0.247 millicurie reference Date: 05/29/2018

The patient tolerated the procedure well and was released from the
[REDACTED]. She was given instructions regarding seed removal.
IMPRESSION: Radioactive seed localization right breast. No apparent
complications.

## 2021-04-13 DIAGNOSIS — H25813 Combined forms of age-related cataract, bilateral: Secondary | ICD-10-CM | POA: Diagnosis not present

## 2021-04-13 DIAGNOSIS — H35363 Drusen (degenerative) of macula, bilateral: Secondary | ICD-10-CM | POA: Diagnosis not present

## 2021-04-13 DIAGNOSIS — H02831 Dermatochalasis of right upper eyelid: Secondary | ICD-10-CM | POA: Diagnosis not present

## 2021-04-13 DIAGNOSIS — H02834 Dermatochalasis of left upper eyelid: Secondary | ICD-10-CM | POA: Diagnosis not present

## 2021-04-13 DIAGNOSIS — H04123 Dry eye syndrome of bilateral lacrimal glands: Secondary | ICD-10-CM | POA: Diagnosis not present

## 2021-05-14 DIAGNOSIS — E559 Vitamin D deficiency, unspecified: Secondary | ICD-10-CM | POA: Diagnosis not present

## 2021-05-14 DIAGNOSIS — E039 Hypothyroidism, unspecified: Secondary | ICD-10-CM | POA: Diagnosis not present

## 2021-05-14 DIAGNOSIS — E785 Hyperlipidemia, unspecified: Secondary | ICD-10-CM | POA: Diagnosis not present

## 2021-05-19 DIAGNOSIS — Z8543 Personal history of malignant neoplasm of ovary: Secondary | ICD-10-CM | POA: Diagnosis not present

## 2021-05-19 DIAGNOSIS — I6529 Occlusion and stenosis of unspecified carotid artery: Secondary | ICD-10-CM | POA: Diagnosis not present

## 2021-05-19 DIAGNOSIS — Z0000 Encounter for general adult medical examination without abnormal findings: Secondary | ICD-10-CM | POA: Diagnosis not present

## 2021-05-19 DIAGNOSIS — E785 Hyperlipidemia, unspecified: Secondary | ICD-10-CM | POA: Diagnosis not present

## 2021-05-19 DIAGNOSIS — C50911 Malignant neoplasm of unspecified site of right female breast: Secondary | ICD-10-CM | POA: Diagnosis not present

## 2021-05-19 DIAGNOSIS — Z8673 Personal history of transient ischemic attack (TIA), and cerebral infarction without residual deficits: Secondary | ICD-10-CM | POA: Diagnosis not present

## 2021-05-19 DIAGNOSIS — E559 Vitamin D deficiency, unspecified: Secondary | ICD-10-CM | POA: Diagnosis not present

## 2021-05-19 DIAGNOSIS — E039 Hypothyroidism, unspecified: Secondary | ICD-10-CM | POA: Diagnosis not present

## 2021-05-19 DIAGNOSIS — K219 Gastro-esophageal reflux disease without esophagitis: Secondary | ICD-10-CM | POA: Diagnosis not present

## 2021-05-19 DIAGNOSIS — Z7901 Long term (current) use of anticoagulants: Secondary | ICD-10-CM | POA: Diagnosis not present

## 2021-05-28 ENCOUNTER — Telehealth: Payer: Self-pay | Admitting: Adult Health

## 2021-05-28 NOTE — Telephone Encounter (Signed)
Pt called wanting to speak to the RN to see if she should get a repeat Carotid Doppler since its been a while since she has had one. Please advise. ?

## 2021-05-31 NOTE — Telephone Encounter (Signed)
Spoke with patient and reviewed NP's note with her. She verbalized understanding, appreciation. ? ?

## 2021-05-31 NOTE — Telephone Encounter (Signed)
She was last seen back in 2020. CTA head/neck during stroke work up was unremarkable and did not show any concerns of carotid stenosis. I do not see any specific reason for carotid ultrasound but this is something PCP can order and/or follow up on if they believe this should be completed. No indication for one from our standpoint.  ?

## 2021-05-31 NOTE — Telephone Encounter (Signed)
Called patient who stated NP at her PCP's office wanted her to ask about test. It has been almost 3 years since she had carotid doppler. She stated she is not having any symptoms at all. She doesn't see a cardiologist for any issues. I advised will ask Janett Billow NP and let her know. Patient verbalized understanding, appreciation. ? ?

## 2021-06-04 ENCOUNTER — Other Ambulatory Visit: Payer: Self-pay | Admitting: Obstetrics and Gynecology

## 2021-06-04 DIAGNOSIS — Z1231 Encounter for screening mammogram for malignant neoplasm of breast: Secondary | ICD-10-CM

## 2021-06-10 NOTE — Progress Notes (Incomplete)
? ?Patient Care Team: ?Deland Pretty, MD as PCP - General (Internal Medicine) ?Rolm Bookbinder, MD as Consulting Physician (General Surgery) ?Nicholas Lose, MD as Consulting Physician (Hematology and Oncology) ?Gery Pray, MD as Consulting Physician (Radiation Oncology) ? ?DIAGNOSIS: No diagnosis found. ? ?SUMMARY OF ONCOLOGIC HISTORY: ?Oncology History  ?Malignant neoplasm of upper-outer quadrant of right breast in female, estrogen receptor positive ([place])  ?06/06/2018 Initial Diagnosis  ? Screening detected right breast mass at 9 o'clock position: 6 mm by ultrasound, axilla negative, grade 3 IDC, ER 100%, PR 95%, Ki-67 80%, HER-2 1+ negative, T1BN0 stage Ia clinical stage ?  ?06/13/2018 Cancer Staging  ? Staging form: Breast, AJCC 8th Edition ?- Clinical: Stage IA (cT1b, cN0, cM0, G3, ER+, PR+, HER2-) - Signed by Nicholas Lose, MD on 06/13/2018 ? ?  ?06/25/2018 Surgery  ? Right lumpectomy: Grade 3 IDC, 0.9 cm, margins negative, 0/7 lymph nodes negative, ER 100%, PR 95%, HER-2 negative, Ki-67 80%, T1 BN 0 stage Ia ?  ?06/25/2018 Cancer Staging  ? Staging form: Breast, AJCC 8th Edition ?- Pathologic stage from 06/25/2018: Stage IA (pT1b, pN0, cM0, G3, ER+, PR+, HER2-, Oncotype DX score: 24) - Signed by Gardenia Phlegm, NP on 11/28/2018 ? ?  ?07/16/2018 Oncotype testing  ? Oncotype DX recurrence score 24, risk of recurrence 10% with hormone therapy ?  ?07/27/2018 -  Radiation Therapy  ? Adjuvant XRT ?  ?08/2018 -  Anti-estrogen oral therapy  ? Anastrozole daily ?  ? ? ?CHIEF COMPLIANT:  Follow-up of right breast cancer on anastrozole ?  ? ?INTERVAL HISTORY: Alexandra Powell is a 76 y.o. with above-mentioned history of right breast cancer who underwent a lumpectomy, radiation, and is currently on antiestrogen therapy with anastrozole. Mammogram on 07/13/2020 showed no evidence of malignancy bilaterally. She presents to the clinic today for follow-up ? ? ?ALLERGIES:  is allergic to erythromycin, keflex [cephalexin],  cephalosporins, and lipitor [atorvastatin]. ? ?MEDICATIONS:  ?Current Outpatient Medications  ?Medication Sig Dispense Refill  ? acetaminophen (TYLENOL) 500 MG tablet Take 2 tablets (1,000 mg total) by mouth in the morning and at bedtime. 30 tablet 0  ? anastrozole (ARIMIDEX) 1 MG tablet Take 1 tablet by mouth once daily 90 tablet 3  ? calcium carbonate (TUMS - DOSED IN MG ELEMENTAL CALCIUM) 500 MG chewable tablet Chew 1 tablet by mouth daily.    ? cetirizine (KLS ALLER-TEC) 10 MG tablet Take 1 tablet (10 mg total) by mouth daily.    ? clopidogrel (PLAVIX) 75 MG tablet Take 1 tablet (75 mg total) by mouth daily. 30 tablet 11  ? co-enzyme Q-10 30 MG capsule Take 200 mg by mouth 2 (two) times daily.     ? EUTHYROX 75 MCG tablet TAKE 1 TABLET BY MOUTH IN THE MORNING ON AN EMPTY STOMACH ONCE DAILY FOR 30 DAYS    ? Fe Bisgly-Vit C-Vit B12-FA 28-60-0.008-0.4 MG CAPS Take 1 tablet by mouth daily.    ? ketoconazole (NIZORAL) 2 % cream APPLY 1 APPLICATION TOPICALLY DAILY 30 g 1  ? magnesium oxide (MAG-OX) 400 MG tablet Take 400 mg by mouth daily.    ? Omega-3 Fatty Acids (FISH OIL) 500 MG CAPS Take 750 mg by mouth.     ? pantoprazole (PROTONIX) 40 MG tablet Take 40 mg by mouth daily. Delayed release    ? rosuvastatin (CRESTOR) 20 MG tablet Take 20 mg by mouth daily.    ? Vitamin D, Ergocalciferol, (DRISDOL) 1.25 MG (50000 UT) CAPS capsule Take 50,000 Units by  mouth every 7 (seven) days. Pt takes on Friday of each week    ? ?No current facility-administered medications for this visit.  ? ? ?PHYSICAL EXAMINATION: ?ECOG PERFORMANCE STATUS: {CHL ONC ECOG [id number]} ? ?There were no vitals filed for this visit. ?There were no vitals filed for this visit. ? ?BREAST:*** No palpable masses or nodules in either right or left breasts. No palpable axillary supraclavicular or infraclavicular adenopathy no breast tenderness or nipple discharge. (exam performed in the presence of a chaperone) ? ?LABORATORY DATA:  ?I have reviewed the  data as listed ? ?  Latest Ref Rng & Units 06/13/2018  ? 12:11 PM 04/21/2018  ? 11:02 AM 04/21/2018  ? 10:57 AM  ?CMP  ?Glucose 70 - 99 mg/dL 100    102    ?BUN 8 - 23 mg/dL 17    14    ?Creatinine 0.44 - 1.00 mg/dL 1.01   0.90   0.92    ?Sodium 135 - 145 mmol/L 142    139    ?Potassium 3.5 - 5.1 mmol/L 4.4    3.8    ?Chloride 98 - 111 mmol/L 104    104    ?CO2 22 - 32 mmol/L 27    25    ?Calcium 8.9 - 10.3 mg/dL 9.6    9.0    ?Total Protein 6.5 - 8.1 g/dL 8.1    6.9    ?Total Bilirubin 0.3 - 1.2 mg/dL 0.5    0.5    ?Alkaline Phos 38 - 126 U/L 115    77    ?AST 15 - 41 U/L 28    22    ?ALT 0 - 44 U/L 30    24    ? ? ?Lab Results  ?Component Value Date  ? WBC 6.4 06/13/2018  ? HGB 13.2 06/13/2018  ? HCT 40.1 06/13/2018  ? MCV 88.7 06/13/2018  ? PLT 177 06/13/2018  ? NEUTROABS 3.6 06/13/2018  ? ? ?ASSESSMENT & PLAN:  ?No problem-specific Assessment & Plan notes found for this encounter. ? ? ? ?No orders of the defined types were placed in this encounter. ? ?The patient has a good understanding of the overall plan. she agrees with it. she will call with any problems that may develop before the next visit here. ?Total time spent: 30 mins including face to face time and time spent for planning, charting and co-ordination of care ? ? Suzzette Righter, CMA ?06/10/21 ? ? ? I Gardiner Coins am scribing for Dr. Lindi Adie ? ?***  ?

## 2021-06-11 ENCOUNTER — Inpatient Hospital Stay: Payer: Medicare Other | Admitting: Hematology and Oncology

## 2021-07-06 DIAGNOSIS — L509 Urticaria, unspecified: Secondary | ICD-10-CM | POA: Diagnosis not present

## 2021-07-14 ENCOUNTER — Ambulatory Visit
Admission: RE | Admit: 2021-07-14 | Discharge: 2021-07-14 | Disposition: A | Discharge status: Home / Self Care | Payer: PPO | Source: Ambulatory Visit | Attending: Obstetrics and Gynecology | Admitting: Obstetrics and Gynecology

## 2021-07-14 DIAGNOSIS — Z1231 Encounter for screening mammogram for malignant neoplasm of breast: Secondary | ICD-10-CM | POA: Diagnosis not present

## 2021-07-21 DIAGNOSIS — E039 Hypothyroidism, unspecified: Secondary | ICD-10-CM | POA: Diagnosis not present

## 2021-08-13 DIAGNOSIS — Z01419 Encounter for gynecological examination (general) (routine) without abnormal findings: Secondary | ICD-10-CM | POA: Diagnosis not present

## 2021-08-31 ENCOUNTER — Other Ambulatory Visit: Payer: Self-pay | Admitting: Hematology and Oncology

## 2021-09-15 ENCOUNTER — Other Ambulatory Visit: Payer: Self-pay | Admitting: Gastroenterology

## 2021-09-15 DIAGNOSIS — Z800 Family history of malignant neoplasm of digestive organs: Secondary | ICD-10-CM

## 2021-10-07 DIAGNOSIS — Z170 Estrogen receptor positive status [ER+]: Secondary | ICD-10-CM | POA: Diagnosis not present

## 2021-10-07 DIAGNOSIS — C50411 Malignant neoplasm of upper-outer quadrant of right female breast: Secondary | ICD-10-CM | POA: Diagnosis not present

## 2021-10-09 ENCOUNTER — Other Ambulatory Visit: Payer: Self-pay | Admitting: Hematology and Oncology

## 2021-11-17 ENCOUNTER — Ambulatory Visit
Admission: RE | Admit: 2021-11-17 | Discharge: 2021-11-17 | Disposition: A | Discharge status: Home / Self Care | Payer: PPO | Source: Ambulatory Visit | Attending: Gastroenterology | Admitting: Gastroenterology

## 2021-11-17 DIAGNOSIS — Z800 Family history of malignant neoplasm of digestive organs: Secondary | ICD-10-CM

## 2021-11-17 DIAGNOSIS — I700 Atherosclerosis of aorta: Secondary | ICD-10-CM | POA: Diagnosis not present

## 2021-11-17 DIAGNOSIS — K429 Umbilical hernia without obstruction or gangrene: Secondary | ICD-10-CM | POA: Diagnosis not present

## 2021-11-17 DIAGNOSIS — I2510 Atherosclerotic heart disease of native coronary artery without angina pectoris: Secondary | ICD-10-CM | POA: Diagnosis not present

## 2021-11-18 DIAGNOSIS — Z8543 Personal history of malignant neoplasm of ovary: Secondary | ICD-10-CM | POA: Diagnosis not present

## 2021-11-18 DIAGNOSIS — E785 Hyperlipidemia, unspecified: Secondary | ICD-10-CM | POA: Diagnosis not present

## 2021-11-26 DIAGNOSIS — E785 Hyperlipidemia, unspecified: Secondary | ICD-10-CM | POA: Diagnosis not present

## 2021-11-26 DIAGNOSIS — E559 Vitamin D deficiency, unspecified: Secondary | ICD-10-CM | POA: Diagnosis not present

## 2021-11-26 DIAGNOSIS — N6310 Unspecified lump in the right breast, unspecified quadrant: Secondary | ICD-10-CM | POA: Diagnosis not present

## 2021-11-26 DIAGNOSIS — E039 Hypothyroidism, unspecified: Secondary | ICD-10-CM | POA: Diagnosis not present

## 2021-11-26 DIAGNOSIS — Z8543 Personal history of malignant neoplasm of ovary: Secondary | ICD-10-CM | POA: Diagnosis not present

## 2021-12-01 ENCOUNTER — Other Ambulatory Visit: Payer: Self-pay | Admitting: Registered Nurse

## 2021-12-01 DIAGNOSIS — N63 Unspecified lump in unspecified breast: Secondary | ICD-10-CM

## 2021-12-14 ENCOUNTER — Ambulatory Visit: Payer: PPO

## 2021-12-14 ENCOUNTER — Ambulatory Visit
Admission: RE | Admit: 2021-12-14 | Discharge: 2021-12-14 | Disposition: A | Discharge status: Home / Self Care | Payer: PPO | Source: Ambulatory Visit | Attending: Registered Nurse | Admitting: Registered Nurse

## 2021-12-14 DIAGNOSIS — Z853 Personal history of malignant neoplasm of breast: Secondary | ICD-10-CM | POA: Diagnosis not present

## 2021-12-14 DIAGNOSIS — N63 Unspecified lump in unspecified breast: Secondary | ICD-10-CM

## 2022-02-13 NOTE — Progress Notes (Signed)
Patient Care Team: Deland Pretty, MD as PCP - General (Internal Medicine) Rolm Bookbinder, MD as Consulting Physician (General Surgery) Nicholas Lose, MD as Consulting Physician (Hematology and Oncology) Gery Pray, MD as Consulting Physician (Radiation Oncology)  DIAGNOSIS:  Encounter Diagnosis  Name Primary?   Malignant neoplasm of upper-outer quadrant of right breast in female, estrogen receptor positive ([place]) Yes    SUMMARY OF ONCOLOGIC HISTORY: Oncology History  Malignant neoplasm of upper-outer quadrant of right breast in female, estrogen receptor positive ([place])  06/06/2018 Initial Diagnosis   Screening detected right breast mass at 9 o'clock position: 6 mm by ultrasound, axilla negative, grade 3 IDC, ER 100%, PR 95%, Ki-67 80%, HER-2 1+ negative, T1BN0 stage Ia clinical stage   06/13/2018 Cancer Staging   Staging form: Breast, AJCC 8th Edition - Clinical: Stage IA (cT1b, cN0, cM0, G3, ER+, PR+, HER2-) - Signed by Nicholas Lose, MD on 06/13/2018   06/25/2018 Surgery   Right lumpectomy: Grade 3 IDC, 0.9 cm, margins negative, 0/7 lymph nodes negative, ER 100%, PR 95%, HER-2 negative, Ki-67 80%, T1 BN 0 stage Ia   06/25/2018 Cancer Staging   Staging form: Breast, AJCC 8th Edition - Pathologic stage from 06/25/2018: Stage IA (pT1b, pN0, cM0, G3, ER+, PR+, HER2-, Oncotype DX score: 24) - Signed by Gardenia Phlegm, NP on 11/28/2018   07/16/2018 Oncotype testing   Oncotype DX recurrence score 24, risk of recurrence 10% with hormone therapy   07/27/2018 -  Radiation Therapy   Adjuvant XRT   08/2018 -  Anti-estrogen oral therapy   Anastrozole daily     CHIEF COMPLIANT:  Follow-up right breast cancer surveillance  INTERVAL HISTORY: Alexandra Powell is a 76 y.o. with above-mentioned history of right breast cancer who underwent a lumpectomy, radiation, and is currently on antiestrogen therapy with anastrozole. She presents to the clinic for a follow-up. She states she does  get hot flashes and aches and also     ALLERGIES:  is allergic to erythromycin, keflex [cephalexin], cephalosporins, and lipitor [atorvastatin].  MEDICATIONS:  Current Outpatient Medications  Medication Sig Dispense Refill   acetaminophen (TYLENOL) 500 MG tablet Take 2 tablets (1,000 mg total) by mouth in the morning and at bedtime. 30 tablet 0   anastrozole (ARIMIDEX) 1 MG tablet Take 1 tablet by mouth once daily 90 tablet 3   calcium carbonate (TUMS - DOSED IN MG ELEMENTAL CALCIUM) 500 MG chewable tablet Chew 1 tablet by mouth daily.     cetirizine (KLS ALLER-TEC) 10 MG tablet Take 1 tablet (10 mg total) by mouth daily.     clopidogrel (PLAVIX) 75 MG tablet Take 1 tablet (75 mg total) by mouth daily. 30 tablet 11   co-enzyme Q-10 30 MG capsule Take 200 mg by mouth 2 (two) times daily.      EUTHYROX 75 MCG tablet 88 mcg.     Fe Bisgly-Vit C-Vit B12-FA 28-60-0.008-0.4 MG CAPS Take 1 tablet by mouth daily.     ketoconazole (NIZORAL) 2 % cream APPLY 1 APPLICATION TOPICALLY DAILY 30 g 1   magnesium oxide (MAG-OX) 400 MG tablet Take 400 mg by mouth daily.     Omega-3 Fatty Acids (FISH OIL) 500 MG CAPS Take 750 mg by mouth.      pantoprazole (PROTONIX) 40 MG tablet Take 40 mg by mouth daily. Delayed release     rosuvastatin (CRESTOR) 20 MG tablet Take 20 mg by mouth daily.     Vitamin D, Ergocalciferol, (DRISDOL) 1.25 MG (50000 UT) CAPS  capsule Take 50,000 Units by mouth every 7 (seven) days. Pt takes on Friday of each week     No current facility-administered medications for this visit.    PHYSICAL EXAMINATION: ECOG PERFORMANCE STATUS: 1 - Symptomatic but completely ambulatory  Vitals:   02/16/22 1004  BP: (!) 159/67  Pulse: 72  Resp: 18  Temp: 97.9 F (36.6 C)  SpO2: 96%   Filed Weights   02/16/22 1004  Weight: 224 lb 11.2 oz (101.9 kg)    BREAST: No palpable masses or nodules in either right or left breasts. No palpable axillary supraclavicular or infraclavicular adenopathy  no breast tenderness or nipple discharge. (exam performed in the presence of a chaperone)  LABORATORY DATA:  I have reviewed the data as listed    Latest Ref Rng & Units 06/13/2018   12:11 PM 04/21/2018   11:02 AM 04/21/2018   10:57 AM  CMP  Glucose 70 - 99 mg/dL 100   102   BUN 8 - 23 mg/dL 17   14   Creatinine 0.44 - 1.00 mg/dL 1.01  0.90  0.92   Sodium 135 - 145 mmol/L 142   139   Potassium 3.5 - 5.1 mmol/L 4.4   3.8   Chloride 98 - 111 mmol/L 104   104   CO2 22 - 32 mmol/L 27   25   Calcium 8.9 - 10.3 mg/dL 9.6   9.0   Total Protein 6.5 - 8.1 g/dL 8.1   6.9   Total Bilirubin 0.3 - 1.2 mg/dL 0.5   0.5   Alkaline Phos 38 - 126 U/L 115   77   AST 15 - 41 U/L 28   22   ALT 0 - 44 U/L 30   24     Lab Results  Component Value Date   WBC 6.4 06/13/2018   HGB 13.2 06/13/2018   HCT 40.1 06/13/2018   MCV 88.7 06/13/2018   PLT 177 06/13/2018   NEUTROABS 3.6 06/13/2018    ASSESSMENT & PLAN:  Malignant neoplasm of upper-outer quadrant of right breast in female, estrogen receptor positive ([place]) 06/25/2018:Right lumpectomy: Grade 3 IDC, 0.9 cm, margins negative, 0/7 lymph nodes negative, ER 100%, PR 95%, HER-2 negative, Ki-67 80%, T1 BN 0 stage Ia Oncotype DX: 24, risk of recurrence at 9 years 10% Current treatment: Adjuvant radiation therapy started 07/27/2018   Treatment plan: Adjuvant antiestrogen therapy with anastrozole 1 mg daily x5 to 7 years started 08/22/2018   Anastrozole toxicities: tolerating well.  Complains of fatigue and weight gain. Monitoring bone density closely.  Osteopenia noted on 06/2019 bone density.   Breast cancer surveillance: No sign of recurrence Mammogram right breast 11/24/2021: Benign breast density category B Breast exam 02/16/2022: Benign   Return to clinic in 1 year for follow-up    No orders of the defined types were placed in this encounter.  The patient has a good understanding of the overall plan. she agrees with it. she will call with any  problems that may develop before the next visit here. Total time spent: 30 mins including face to face time and time spent for planning, charting and co-ordination of care   Harriette Ohara, MD 02/16/22    I Gardiner Coins am scribing for Dr. Lindi Adie  I have reviewed the above documentation for accuracy and completeness, and I agree with the above.

## 2022-02-14 ENCOUNTER — Other Ambulatory Visit: Payer: Self-pay | Admitting: Hematology and Oncology

## 2022-02-16 ENCOUNTER — Other Ambulatory Visit: Payer: Self-pay

## 2022-02-16 ENCOUNTER — Inpatient Hospital Stay: Payer: PPO | Attending: Hematology and Oncology | Admitting: Hematology and Oncology

## 2022-02-16 VITALS — BP 159/67 | HR 72 | Temp 97.9°F | Resp 18 | Ht 61.5 in | Wt 224.7 lb

## 2022-02-16 DIAGNOSIS — C50411 Malignant neoplasm of upper-outer quadrant of right female breast: Secondary | ICD-10-CM | POA: Insufficient documentation

## 2022-02-16 DIAGNOSIS — Z79899 Other long term (current) drug therapy: Secondary | ICD-10-CM | POA: Diagnosis not present

## 2022-02-16 DIAGNOSIS — Z79811 Long term (current) use of aromatase inhibitors: Secondary | ICD-10-CM | POA: Insufficient documentation

## 2022-02-16 DIAGNOSIS — Z170 Estrogen receptor positive status [ER+]: Secondary | ICD-10-CM | POA: Insufficient documentation

## 2022-02-16 DIAGNOSIS — M8580 Other specified disorders of bone density and structure, unspecified site: Secondary | ICD-10-CM | POA: Diagnosis not present

## 2022-02-16 DIAGNOSIS — Z923 Personal history of irradiation: Secondary | ICD-10-CM | POA: Diagnosis not present

## 2022-02-16 NOTE — Assessment & Plan Note (Signed)
06/25/2018:Right lumpectomy: Grade 3 IDC, 0.9 cm, margins negative, 0/7 lymph nodes negative, ER 100%, PR 95%, HER-2 negative, Ki-67 80%, T1 BN 0 stage Ia Oncotype DX: 24, risk of recurrence at 9 years 10% Current treatment: Adjuvant radiation therapy started 07/27/2018   Treatment plan: Adjuvant antiestrogen therapy with anastrozole 1 mg daily x5 to 7 years started 08/22/2018   Anastrozole toxicities: tolerating well.  Complains of fatigue and weight gain. Monitoring bone density closely.  Osteopenia noted on 06/2019 bone density.   Breast cancer surveillance: No sign of recurrence Mammogram right breast 11/24/2021: Benign breast density category B Breast exam 02/16/2022: Benign   Return to clinic in 1 year for follow-up

## 2022-04-20 DIAGNOSIS — H02831 Dermatochalasis of right upper eyelid: Secondary | ICD-10-CM | POA: Diagnosis not present

## 2022-04-20 DIAGNOSIS — H25813 Combined forms of age-related cataract, bilateral: Secondary | ICD-10-CM | POA: Diagnosis not present

## 2022-04-20 DIAGNOSIS — H02834 Dermatochalasis of left upper eyelid: Secondary | ICD-10-CM | POA: Diagnosis not present

## 2022-04-20 DIAGNOSIS — H04123 Dry eye syndrome of bilateral lacrimal glands: Secondary | ICD-10-CM | POA: Diagnosis not present

## 2022-05-16 DIAGNOSIS — Z0000 Encounter for general adult medical examination without abnormal findings: Secondary | ICD-10-CM | POA: Diagnosis not present

## 2022-05-16 DIAGNOSIS — E785 Hyperlipidemia, unspecified: Secondary | ICD-10-CM | POA: Diagnosis not present

## 2022-05-16 DIAGNOSIS — E559 Vitamin D deficiency, unspecified: Secondary | ICD-10-CM | POA: Diagnosis not present

## 2022-05-23 DIAGNOSIS — E785 Hyperlipidemia, unspecified: Secondary | ICD-10-CM | POA: Diagnosis not present

## 2022-05-23 DIAGNOSIS — E559 Vitamin D deficiency, unspecified: Secondary | ICD-10-CM | POA: Diagnosis not present

## 2022-05-23 DIAGNOSIS — R7309 Other abnormal glucose: Secondary | ICD-10-CM | POA: Diagnosis not present

## 2022-05-23 DIAGNOSIS — Z8543 Personal history of malignant neoplasm of ovary: Secondary | ICD-10-CM | POA: Diagnosis not present

## 2022-05-23 DIAGNOSIS — K219 Gastro-esophageal reflux disease without esophagitis: Secondary | ICD-10-CM | POA: Diagnosis not present

## 2022-05-23 DIAGNOSIS — I6529 Occlusion and stenosis of unspecified carotid artery: Secondary | ICD-10-CM | POA: Diagnosis not present

## 2022-05-23 DIAGNOSIS — E039 Hypothyroidism, unspecified: Secondary | ICD-10-CM | POA: Diagnosis not present

## 2022-05-23 DIAGNOSIS — Z0000 Encounter for general adult medical examination without abnormal findings: Secondary | ICD-10-CM | POA: Diagnosis not present

## 2022-05-23 DIAGNOSIS — Z8673 Personal history of transient ischemic attack (TIA), and cerebral infarction without residual deficits: Secondary | ICD-10-CM | POA: Diagnosis not present

## 2022-05-24 ENCOUNTER — Encounter: Payer: Self-pay | Admitting: Hematology and Oncology

## 2022-05-30 ENCOUNTER — Other Ambulatory Visit: Payer: Self-pay | Admitting: Hematology and Oncology

## 2022-05-30 DIAGNOSIS — Z1231 Encounter for screening mammogram for malignant neoplasm of breast: Secondary | ICD-10-CM

## 2022-06-01 DIAGNOSIS — H903 Sensorineural hearing loss, bilateral: Secondary | ICD-10-CM | POA: Diagnosis not present

## 2022-06-03 DIAGNOSIS — H25811 Combined forms of age-related cataract, right eye: Secondary | ICD-10-CM | POA: Diagnosis not present

## 2022-06-14 DIAGNOSIS — N6320 Unspecified lump in the left breast, unspecified quadrant: Secondary | ICD-10-CM | POA: Diagnosis not present

## 2022-06-15 ENCOUNTER — Other Ambulatory Visit: Payer: Self-pay | Admitting: Obstetrics and Gynecology

## 2022-06-15 DIAGNOSIS — N6320 Unspecified lump in the left breast, unspecified quadrant: Secondary | ICD-10-CM

## 2022-06-29 ENCOUNTER — Ambulatory Visit
Admission: RE | Admit: 2022-06-29 | Discharge: 2022-06-29 | Disposition: A | Discharge status: Home / Self Care | Payer: PPO | Source: Ambulatory Visit | Attending: Obstetrics and Gynecology | Admitting: Obstetrics and Gynecology

## 2022-06-29 ENCOUNTER — Other Ambulatory Visit: Payer: Self-pay | Admitting: Obstetrics and Gynecology

## 2022-06-29 DIAGNOSIS — R928 Other abnormal and inconclusive findings on diagnostic imaging of breast: Secondary | ICD-10-CM | POA: Diagnosis not present

## 2022-06-29 DIAGNOSIS — N6320 Unspecified lump in the left breast, unspecified quadrant: Secondary | ICD-10-CM

## 2022-06-29 DIAGNOSIS — H2512 Age-related nuclear cataract, left eye: Secondary | ICD-10-CM | POA: Diagnosis not present

## 2022-06-29 DIAGNOSIS — N6323 Unspecified lump in the left breast, lower outer quadrant: Secondary | ICD-10-CM | POA: Diagnosis not present

## 2022-07-01 DIAGNOSIS — H25812 Combined forms of age-related cataract, left eye: Secondary | ICD-10-CM | POA: Diagnosis not present

## 2022-07-18 ENCOUNTER — Ambulatory Visit: Payer: PPO

## 2022-08-15 ENCOUNTER — Other Ambulatory Visit: Payer: Self-pay | Admitting: Hematology and Oncology

## 2022-09-29 ENCOUNTER — Ambulatory Visit
Admission: RE | Admit: 2022-09-29 | Discharge: 2022-09-29 | Disposition: A | Discharge status: Home / Self Care | Payer: PPO | Source: Ambulatory Visit | Attending: Obstetrics and Gynecology | Admitting: Obstetrics and Gynecology

## 2022-09-29 DIAGNOSIS — N641 Fat necrosis of breast: Secondary | ICD-10-CM | POA: Diagnosis not present

## 2022-09-29 DIAGNOSIS — N6320 Unspecified lump in the left breast, unspecified quadrant: Secondary | ICD-10-CM

## 2022-10-24 DIAGNOSIS — M1711 Unilateral primary osteoarthritis, right knee: Secondary | ICD-10-CM | POA: Diagnosis not present

## 2022-11-23 DIAGNOSIS — E785 Hyperlipidemia, unspecified: Secondary | ICD-10-CM | POA: Diagnosis not present

## 2022-11-23 DIAGNOSIS — R7309 Other abnormal glucose: Secondary | ICD-10-CM | POA: Diagnosis not present

## 2022-11-23 DIAGNOSIS — Z8543 Personal history of malignant neoplasm of ovary: Secondary | ICD-10-CM | POA: Diagnosis not present

## 2022-11-25 DIAGNOSIS — M1711 Unilateral primary osteoarthritis, right knee: Secondary | ICD-10-CM | POA: Diagnosis not present

## 2022-11-30 ENCOUNTER — Telehealth: Payer: Self-pay

## 2022-11-30 DIAGNOSIS — K219 Gastro-esophageal reflux disease without esophagitis: Secondary | ICD-10-CM | POA: Diagnosis not present

## 2022-11-30 DIAGNOSIS — E559 Vitamin D deficiency, unspecified: Secondary | ICD-10-CM | POA: Diagnosis not present

## 2022-11-30 DIAGNOSIS — I6529 Occlusion and stenosis of unspecified carotid artery: Secondary | ICD-10-CM | POA: Diagnosis not present

## 2022-11-30 DIAGNOSIS — E785 Hyperlipidemia, unspecified: Secondary | ICD-10-CM | POA: Diagnosis not present

## 2022-11-30 DIAGNOSIS — Z7901 Long term (current) use of anticoagulants: Secondary | ICD-10-CM | POA: Diagnosis not present

## 2022-11-30 DIAGNOSIS — Z23 Encounter for immunization: Secondary | ICD-10-CM | POA: Diagnosis not present

## 2022-11-30 DIAGNOSIS — R7309 Other abnormal glucose: Secondary | ICD-10-CM | POA: Diagnosis not present

## 2022-11-30 DIAGNOSIS — R748 Abnormal levels of other serum enzymes: Secondary | ICD-10-CM | POA: Diagnosis not present

## 2022-11-30 DIAGNOSIS — C50911 Malignant neoplasm of unspecified site of right female breast: Secondary | ICD-10-CM | POA: Diagnosis not present

## 2022-11-30 DIAGNOSIS — E039 Hypothyroidism, unspecified: Secondary | ICD-10-CM | POA: Diagnosis not present

## 2022-11-30 NOTE — Patient Instructions (Signed)
Visit Information  Thank you for taking time to visit with me today. Please don't hesitate to contact me if I can be of assistance to you.   Following are the goals we discussed today:   Goals Addressed             This Visit's Progress    Care Coordination Activities-No follow up required       Care Coordination Interventions: Advised patient to Annual Wellness exam. Discussed [facility] services and support. Assessed SDOH. Advised to discuss with primary care physician if services needed in the future.           If you are experiencing a Mental Health or Behavioral Health Crisis or need someone to talk to, please call the Suicide and Crisis Lifeline: 988   The patient verbalized understanding of instructions, educational materials, and care plan provided today and DECLINED offer to receive copy of patient instructions, educational materials, and care plan.   The patient has been provided with contact information for the care management team and has been advised to call with any health related questions or concerns.   Bary Leriche, RN, MSN [facility], [facility] Management Community Coordinator Direct Dial: [phone]  Fax: [phone] Website: [email or web address]

## 2022-11-30 NOTE — Patient Outreach (Signed)
  Care Coordination   In Person Provider Office Visit Note   11/30/2022 Name: Alexandra Powell MRN: [id number] DOB: December 23, 1945  Alexandra Powell is a 77 y.o. year old female who sees Merri Brunette, MD for primary care. I spoke with  Alexandra Powell by phone today.  What matters to the patients health and wellness today?  none    Goals Addressed             This Visit's Progress    Care Coordination Activities-No follow up required       Care Coordination Interventions: Advised patient to Annual Wellness exam. Discussed [facility] services and support. Assessed SDOH. Advised to discuss with primary care physician if services needed in the future.         SDOH assessments and interventions completed:  Yes     Care Coordination Interventions:  Yes, provided   Follow up plan: No further intervention required.   Encounter Outcome:  Patient Visit Completed   Bary Leriche, RN, MSN [facility]  [facility], [facility] Management Community Coordinator Direct Dial: [phone]  Fax: [phone] Website: [email or web address]

## 2022-12-07 DIAGNOSIS — R748 Abnormal levels of other serum enzymes: Secondary | ICD-10-CM | POA: Diagnosis not present

## 2022-12-07 DIAGNOSIS — N281 Cyst of kidney, acquired: Secondary | ICD-10-CM | POA: Diagnosis not present

## 2022-12-07 DIAGNOSIS — K760 Fatty (change of) liver, not elsewhere classified: Secondary | ICD-10-CM | POA: Diagnosis not present

## 2022-12-23 DIAGNOSIS — M1711 Unilateral primary osteoarthritis, right knee: Secondary | ICD-10-CM | POA: Diagnosis not present

## 2023-01-11 ENCOUNTER — Telehealth: Payer: Self-pay | Admitting: Hematology and Oncology

## 2023-01-11 NOTE — Telephone Encounter (Signed)
Left patient a message in regards to rescheduled appointment due to Gudena being out of office on 02/17/2023

## 2023-02-17 ENCOUNTER — Ambulatory Visit: Payer: PPO | Admitting: Hematology and Oncology

## 2023-02-20 ENCOUNTER — Telehealth: Payer: Self-pay

## 2023-02-20 NOTE — Telephone Encounter (Signed)
Pt called and states 12/6 does not work for her as she has another appt scheduled that morning. She has been scheduled to come in to see MD 03/01/23 at 0945 per her requestr.

## 2023-02-24 ENCOUNTER — Ambulatory Visit: Payer: PPO | Admitting: Hematology and Oncology

## 2023-03-01 ENCOUNTER — Inpatient Hospital Stay: Payer: PPO | Admitting: Hematology and Oncology

## 2023-03-13 ENCOUNTER — Other Ambulatory Visit: Payer: Self-pay | Admitting: Hematology and Oncology

## 2023-03-28 ENCOUNTER — Inpatient Hospital Stay: Payer: PPO | Attending: Hematology and Oncology | Admitting: Hematology and Oncology

## 2023-03-28 VITALS — BP 152/76 | HR 74 | Temp 97.3°F | Resp 18 | Ht 61.5 in | Wt 205.5 lb

## 2023-03-28 DIAGNOSIS — Z79811 Long term (current) use of aromatase inhibitors: Secondary | ICD-10-CM | POA: Insufficient documentation

## 2023-03-28 DIAGNOSIS — Z1721 Progesterone receptor positive status: Secondary | ICD-10-CM | POA: Insufficient documentation

## 2023-03-28 DIAGNOSIS — Z780 Asymptomatic menopausal state: Secondary | ICD-10-CM | POA: Diagnosis not present

## 2023-03-28 DIAGNOSIS — Z79899 Other long term (current) drug therapy: Secondary | ICD-10-CM | POA: Insufficient documentation

## 2023-03-28 DIAGNOSIS — Z1732 Human epidermal growth factor receptor 2 negative status: Secondary | ICD-10-CM | POA: Insufficient documentation

## 2023-03-28 DIAGNOSIS — Z170 Estrogen receptor positive status [ER+]: Secondary | ICD-10-CM | POA: Diagnosis not present

## 2023-03-28 DIAGNOSIS — Z8616 Personal history of COVID-19: Secondary | ICD-10-CM | POA: Diagnosis not present

## 2023-03-28 DIAGNOSIS — M8580 Other specified disorders of bone density and structure, unspecified site: Secondary | ICD-10-CM | POA: Insufficient documentation

## 2023-03-28 DIAGNOSIS — C50411 Malignant neoplasm of upper-outer quadrant of right female breast: Secondary | ICD-10-CM | POA: Diagnosis not present

## 2023-03-28 MED ORDER — ANASTROZOLE 1 MG PO TABS: 1.0000 mg | ORAL_TABLET | Freq: Every day | ORAL | 3 refills | Status: DC

## 2023-03-28 NOTE — Progress Notes (Signed)
 Patient Care Team: Clarice Nottingham, MD as PCP - General (Internal Medicine) Ebbie Cough, MD as Consulting Physician (General Surgery) Odean Potts, MD as Consulting Physician (Hematology and Oncology) Shannon Agent, MD as Consulting Physician (Radiation Oncology)  DIAGNOSIS:  Encounter Diagnoses  Name Primary?   Malignant neoplasm of upper-outer quadrant of right breast in female, estrogen receptor positive (HCC) Yes   Post-menopausal     SUMMARY OF ONCOLOGIC HISTORY: Oncology History  Malignant neoplasm of upper-outer quadrant of right breast in female, estrogen receptor positive (HCC)  06/06/2018 Initial Diagnosis   Screening detected right breast mass at 9 o'clock position: 6 mm by ultrasound, axilla negative, grade 3 IDC, ER 100%, PR 95%, Ki-67 80%, HER-2 1+ negative, T1BN0 stage Ia clinical stage   06/13/2018 Cancer Staging   Staging form: Breast, AJCC 8th Edition - Clinical: Stage IA (cT1b, cN0, cM0, G3, ER+, PR+, HER2-) - Signed by Odean Potts, MD on 06/13/2018   06/25/2018 Surgery   Right lumpectomy: Grade 3 IDC, 0.9 cm, margins negative, 0/7 lymph nodes negative, ER 100%, PR 95%, HER-2 negative, Ki-67 80%, T1 BN 0 stage Ia   06/25/2018 Cancer Staging   Staging form: Breast, AJCC 8th Edition - Pathologic stage from 06/25/2018: Stage IA (pT1b, pN0, cM0, G3, ER+, PR+, HER2-, Oncotype DX score: 24) - Signed by Crawford Morna Pickle, NP on 11/28/2018   07/16/2018 Oncotype testing   Oncotype DX recurrence score 24, risk of recurrence 10% with hormone therapy   07/27/2018 -  Radiation Therapy   Adjuvant XRT   08/2018 -  Anti-estrogen oral therapy   Anastrozole  daily     CHIEF COMPLIANT: Follow-up on anastrozole   HISTORY OF PRESENT ILLNESS:   History of Present Illness   The patient, with a history of breast cancer on anastrozole , presents for her annual follow-up. She reports overall good health over the past year, with no major issues. She did have an episode of  COVID-19 in early December, but has since recovered. She also mentions a knee injury, for which she has received shots, but does not elaborate on the nature of the injury or the treatment.  The patient has been tolerating anastrozole  well, with no significant side effects. She has not had a bone density scan since 2021, and the doctor recommends another due to the potential for anastrozole  to cause bone softening.         ALLERGIES:  is allergic to erythromycin, keflex [cephalexin], cephalosporins, and lipitor  [atorvastatin ].  MEDICATIONS:  Current Outpatient Medications  Medication Sig Dispense Refill   acetaminophen  (TYLENOL ) 500 MG tablet Take 2 tablets (1,000 mg total) by mouth in the morning and at bedtime. 30 tablet 0   anastrozole  (ARIMIDEX ) 1 MG tablet Take 1 tablet (1 mg total) by mouth daily. 90 tablet 3   calcium  carbonate (TUMS - DOSED IN MG ELEMENTAL CALCIUM ) 500 MG chewable tablet Chew 1 tablet by mouth daily.     cetirizine  (KLS ALLER-TEC) 10 MG tablet Take 1 tablet (10 mg total) by mouth daily.     clopidogrel  (PLAVIX ) 75 MG tablet Take 1 tablet (75 mg total) by mouth daily. 30 tablet 11   co-enzyme Q-10 30 MG capsule Take 200 mg by mouth 2 (two) times daily.      EUTHYROX  75 MCG tablet 88 mcg.     Fe Bisgly-Vit C-Vit B12-FA 28-60-0.008-0.4 MG CAPS Take 1 tablet by mouth daily.     ketoconazole  (NIZORAL ) 2 % cream APPLY 1 APPLICATION TOPICALLY DAILY 30 g 1  magnesium oxide (MAG-OX) 400 MG tablet Take 400 mg by mouth daily.     Omega-3 Fatty Acids (FISH OIL) 500 MG CAPS Take 750 mg by mouth.      pantoprazole  (PROTONIX ) 40 MG tablet Take 40 mg by mouth daily. Delayed release     rosuvastatin  (CRESTOR ) 20 MG tablet Take 20 mg by mouth daily.     Vitamin D, Ergocalciferol, (DRISDOL) 1.25 MG (50000 UT) CAPS capsule Take 50,000 Units by mouth every 7 (seven) days. Pt takes on Friday of each week     No current facility-administered medications for this visit.    PHYSICAL  EXAMINATION: ECOG PERFORMANCE STATUS: 1 - Symptomatic but completely ambulatory  Vitals:   03/28/23 1523  BP: (!) 152/76  Pulse: 74  Resp: 18  Temp: (!) 97.3 F (36.3 C)  SpO2: 100%   Filed Weights   03/28/23 1523  Weight: 205 lb 8 oz (93.2 kg)    Physical Exam   BREAST: Normal examination findings.      (exam performed in the presence of a chaperone)  LABORATORY DATA:  I have reviewed the data as listed    Latest Ref Rng & Units 06/13/2018   12:11 PM 04/21/2018   11:02 AM 04/21/2018   10:57 AM  CMP  Glucose 70 - 99 mg/dL 899   897   BUN 8 - 23 mg/dL 17   14   Creatinine 9.55 - 1.00 mg/dL 8.98  9.09  9.07   Sodium 135 - 145 mmol/L 142   139   Potassium 3.5 - 5.1 mmol/L 4.4   3.8   Chloride 98 - 111 mmol/L 104   104   CO2 22 - 32 mmol/L 27   25   Calcium  8.9 - 10.3 mg/dL 9.6   9.0   Total Protein 6.5 - 8.1 g/dL 8.1   6.9   Total Bilirubin 0.3 - 1.2 mg/dL 0.5   0.5   Alkaline Phos 38 - 126 U/L 115   77   AST 15 - 41 U/L 28   22   ALT 0 - 44 U/L 30   24     Lab Results  Component Value Date   WBC 6.4 06/13/2018   HGB 13.2 06/13/2018   HCT 40.1 06/13/2018   MCV 88.7 06/13/2018   PLT 177 06/13/2018   NEUTROABS 3.6 06/13/2018    ASSESSMENT & PLAN:  Malignant neoplasm of upper-outer quadrant of right breast in female, estrogen receptor positive (HCC) 06/25/2018:Right lumpectomy: Grade 3 IDC, 0.9 cm, margins negative, 0/7 lymph nodes negative, ER 100%, PR 95%, HER-2 negative, Ki-67 80%, T1 BN 0 stage Ia Oncotype DX: 24, risk of recurrence at 9 years 10% Current treatment: Adjuvant radiation therapy started 07/27/2018   Treatment plan: Adjuvant antiestrogen therapy with anastrozole  1 mg daily x5 to 7 years started 08/22/2018   Anastrozole  toxicities: tolerating well.  Complains of fatigue and weight gain. Monitoring bone density closely.  Osteopenia noted on 06/2019 bone density.   Breast cancer surveillance: No sign of recurrence Mammogram left breast 09/29/2022 with  ultrasound: Benign breast density category B, ultrasound showed near complete resolution of the echogenic area seen on previous ultrasound Breast exam 03/28/2023: Benign   Return to clinic in 1 year for follow-up ------------------------------------- Assessment and Plan    Breast Cancer History Tolerating Anastrozole  well. No new complaints or concerns. Normal breast exam. -Continue Anastrozole . -Plan for annual follow-up.  Bone Health Last bone density scan in 2021. Anastrozole  can cause bone  softening. -Order bone density scan at [facility]. -Continue current medication regimen.  General Health Recent recovery from COVID-19. No current complaints. -Continue current health practices. -Plan for annual follow-up.          Orders Placed This Encounter  Procedures   DG Bone Density    Standing Status:   Future    Expected Date:   04/28/2023    Expiration Date:   03/27/2024    Reason for Exam (SYMPTOM  OR DIAGNOSIS REQUIRED):   post menopausal    Preferred imaging location?:   [facility]    Release to patient:   Immediate   The patient has a good understanding of the overall plan. she agrees with it. she will call with any problems that may develop before the next visit here. Total time spent: 30 mins including face to face time and time spent for planning, charting and co-ordination of care   Alexandra MARLA Chad, MD 03/29/23

## 2023-03-28 NOTE — Assessment & Plan Note (Signed)
 06/25/2018:Right lumpectomy: Grade 3 IDC, 0.9 cm, margins negative, 0/7 lymph nodes negative, ER 100%, PR 95%, HER-2 negative, Ki-67 80%, T1 BN 0 stage Ia Oncotype DX: 24, risk of recurrence at 9 years 10% Current treatment: Adjuvant radiation therapy started 07/27/2018   Treatment plan: Adjuvant antiestrogen therapy with anastrozole  1 mg daily x5 to 7 years started 08/22/2018   Anastrozole  toxicities: tolerating well.  Complains of fatigue and weight gain. Monitoring bone density closely.  Osteopenia noted on 06/2019 bone density.   Breast cancer surveillance: No sign of recurrence Mammogram left breast 09/29/2022 with ultrasound: Benign breast density category B, ultrasound showed near complete resolution of the echogenic area seen on previous ultrasound Breast exam 03/28/2023: Benign   Return to clinic in 1 year for follow-up

## 2023-04-12 DIAGNOSIS — H04123 Dry eye syndrome of bilateral lacrimal glands: Secondary | ICD-10-CM | POA: Diagnosis not present

## 2023-04-12 DIAGNOSIS — H02834 Dermatochalasis of left upper eyelid: Secondary | ICD-10-CM | POA: Diagnosis not present

## 2023-04-12 DIAGNOSIS — H02831 Dermatochalasis of right upper eyelid: Secondary | ICD-10-CM | POA: Diagnosis not present

## 2023-04-12 DIAGNOSIS — Z961 Presence of intraocular lens: Secondary | ICD-10-CM | POA: Diagnosis not present

## 2023-04-18 ENCOUNTER — Ambulatory Visit (HOSPITAL_BASED_OUTPATIENT_CLINIC_OR_DEPARTMENT_OTHER)
Admission: RE | Admit: 2023-04-18 | Discharge: 2023-04-18 | Disposition: A | Discharge status: Home / Self Care | Payer: PPO | Source: Ambulatory Visit | Attending: Hematology and Oncology | Admitting: Hematology and Oncology

## 2023-04-18 DIAGNOSIS — Z780 Asymptomatic menopausal state: Secondary | ICD-10-CM | POA: Diagnosis not present

## 2023-04-18 DIAGNOSIS — M85852 Other specified disorders of bone density and structure, left thigh: Secondary | ICD-10-CM | POA: Diagnosis not present

## 2023-05-18 DIAGNOSIS — E785 Hyperlipidemia, unspecified: Secondary | ICD-10-CM | POA: Diagnosis not present

## 2023-05-18 DIAGNOSIS — E039 Hypothyroidism, unspecified: Secondary | ICD-10-CM | POA: Diagnosis not present

## 2023-05-18 DIAGNOSIS — Z0000 Encounter for general adult medical examination without abnormal findings: Secondary | ICD-10-CM | POA: Diagnosis not present

## 2023-05-18 DIAGNOSIS — E7800 Pure hypercholesterolemia, unspecified: Secondary | ICD-10-CM | POA: Diagnosis not present

## 2023-05-18 DIAGNOSIS — E559 Vitamin D deficiency, unspecified: Secondary | ICD-10-CM | POA: Diagnosis not present

## 2023-05-29 ENCOUNTER — Other Ambulatory Visit: Payer: Self-pay | Admitting: Hematology and Oncology

## 2023-05-29 DIAGNOSIS — Z1231 Encounter for screening mammogram for malignant neoplasm of breast: Secondary | ICD-10-CM

## 2023-06-01 DIAGNOSIS — Z23 Encounter for immunization: Secondary | ICD-10-CM | POA: Diagnosis not present

## 2023-06-01 DIAGNOSIS — E039 Hypothyroidism, unspecified: Secondary | ICD-10-CM | POA: Diagnosis not present

## 2023-06-01 DIAGNOSIS — R7309 Other abnormal glucose: Secondary | ICD-10-CM | POA: Diagnosis not present

## 2023-06-01 DIAGNOSIS — Z0000 Encounter for general adult medical examination without abnormal findings: Secondary | ICD-10-CM | POA: Diagnosis not present

## 2023-06-01 DIAGNOSIS — E559 Vitamin D deficiency, unspecified: Secondary | ICD-10-CM | POA: Diagnosis not present

## 2023-06-01 DIAGNOSIS — Z8543 Personal history of malignant neoplasm of ovary: Secondary | ICD-10-CM | POA: Diagnosis not present

## 2023-06-01 DIAGNOSIS — K760 Fatty (change of) liver, not elsewhere classified: Secondary | ICD-10-CM | POA: Diagnosis not present

## 2023-06-01 DIAGNOSIS — I6529 Occlusion and stenosis of unspecified carotid artery: Secondary | ICD-10-CM | POA: Diagnosis not present

## 2023-06-01 DIAGNOSIS — C50911 Malignant neoplasm of unspecified site of right female breast: Secondary | ICD-10-CM | POA: Diagnosis not present

## 2023-06-02 ENCOUNTER — Encounter: Payer: Self-pay | Admitting: Registered Nurse

## 2023-07-13 ENCOUNTER — Ambulatory Visit
Admission: RE | Admit: 2023-07-13 | Discharge: 2023-07-13 | Disposition: A | Discharge status: Home / Self Care | Source: Ambulatory Visit | Attending: Hematology and Oncology | Admitting: Hematology and Oncology

## 2023-07-13 DIAGNOSIS — Z1231 Encounter for screening mammogram for malignant neoplasm of breast: Secondary | ICD-10-CM | POA: Diagnosis not present

## 2023-07-25 DIAGNOSIS — L309 Dermatitis, unspecified: Secondary | ICD-10-CM | POA: Diagnosis not present

## 2023-08-11 ENCOUNTER — Other Ambulatory Visit: Payer: Self-pay | Admitting: Hematology and Oncology

## 2023-08-15 DIAGNOSIS — S20211A Contusion of right front wall of thorax, initial encounter: Secondary | ICD-10-CM | POA: Diagnosis not present

## 2023-08-15 DIAGNOSIS — T148XXA Other injury of unspecified body region, initial encounter: Secondary | ICD-10-CM | POA: Diagnosis not present

## 2023-08-15 DIAGNOSIS — Z9181 History of falling: Secondary | ICD-10-CM | POA: Diagnosis not present

## 2023-08-24 DIAGNOSIS — Z01419 Encounter for gynecological examination (general) (routine) without abnormal findings: Secondary | ICD-10-CM | POA: Diagnosis not present

## 2023-11-13 DIAGNOSIS — M5431 Sciatica, right side: Secondary | ICD-10-CM | POA: Diagnosis not present

## 2023-11-13 DIAGNOSIS — M25551 Pain in right hip: Secondary | ICD-10-CM | POA: Diagnosis not present

## 2023-11-13 DIAGNOSIS — M25552 Pain in left hip: Secondary | ICD-10-CM | POA: Diagnosis not present

## 2023-11-30 DIAGNOSIS — R7309 Other abnormal glucose: Secondary | ICD-10-CM | POA: Diagnosis not present

## 2023-11-30 DIAGNOSIS — Z8543 Personal history of malignant neoplasm of ovary: Secondary | ICD-10-CM | POA: Diagnosis not present

## 2023-12-07 ENCOUNTER — Encounter: Payer: Self-pay | Admitting: Registered Nurse

## 2023-12-07 DIAGNOSIS — K760 Fatty (change of) liver, not elsewhere classified: Secondary | ICD-10-CM | POA: Diagnosis not present

## 2023-12-07 DIAGNOSIS — E785 Hyperlipidemia, unspecified: Secondary | ICD-10-CM | POA: Diagnosis not present

## 2023-12-07 DIAGNOSIS — I6529 Occlusion and stenosis of unspecified carotid artery: Secondary | ICD-10-CM | POA: Diagnosis not present

## 2023-12-07 DIAGNOSIS — E039 Hypothyroidism, unspecified: Secondary | ICD-10-CM | POA: Diagnosis not present

## 2023-12-07 DIAGNOSIS — E559 Vitamin D deficiency, unspecified: Secondary | ICD-10-CM | POA: Diagnosis not present

## 2023-12-07 DIAGNOSIS — R7309 Other abnormal glucose: Secondary | ICD-10-CM | POA: Diagnosis not present

## 2023-12-07 DIAGNOSIS — Z8673 Personal history of transient ischemic attack (TIA), and cerebral infarction without residual deficits: Secondary | ICD-10-CM | POA: Diagnosis not present

## 2023-12-07 DIAGNOSIS — Z23 Encounter for immunization: Secondary | ICD-10-CM | POA: Diagnosis not present

## 2023-12-07 DIAGNOSIS — Z7901 Long term (current) use of anticoagulants: Secondary | ICD-10-CM | POA: Diagnosis not present

## 2023-12-07 DIAGNOSIS — K219 Gastro-esophageal reflux disease without esophagitis: Secondary | ICD-10-CM | POA: Diagnosis not present

## 2023-12-27 DIAGNOSIS — M25552 Pain in left hip: Secondary | ICD-10-CM | POA: Diagnosis not present

## 2023-12-27 DIAGNOSIS — M5416 Radiculopathy, lumbar region: Secondary | ICD-10-CM | POA: Diagnosis not present

## 2024-01-26 DIAGNOSIS — M5416 Radiculopathy, lumbar region: Secondary | ICD-10-CM | POA: Diagnosis not present

## 2024-01-31 DIAGNOSIS — D225 Melanocytic nevi of trunk: Secondary | ICD-10-CM | POA: Diagnosis not present

## 2024-01-31 DIAGNOSIS — L821 Other seborrheic keratosis: Secondary | ICD-10-CM | POA: Diagnosis not present

## 2024-01-31 DIAGNOSIS — L918 Other hypertrophic disorders of the skin: Secondary | ICD-10-CM | POA: Diagnosis not present

## 2024-01-31 DIAGNOSIS — L720 Epidermal cyst: Secondary | ICD-10-CM | POA: Diagnosis not present

## 2024-01-31 DIAGNOSIS — D1801 Hemangioma of skin and subcutaneous tissue: Secondary | ICD-10-CM | POA: Diagnosis not present

## 2024-01-31 DIAGNOSIS — D692 Other nonthrombocytopenic purpura: Secondary | ICD-10-CM | POA: Diagnosis not present

## 2024-01-31 DIAGNOSIS — L820 Inflamed seborrheic keratosis: Secondary | ICD-10-CM | POA: Diagnosis not present

## 2024-02-23 DIAGNOSIS — M5442 Lumbago with sciatica, left side: Secondary | ICD-10-CM | POA: Diagnosis not present

## 2024-02-23 DIAGNOSIS — M7062 Trochanteric bursitis, left hip: Secondary | ICD-10-CM | POA: Diagnosis not present

## 2024-03-28 ENCOUNTER — Inpatient Hospital Stay: Payer: PPO | Attending: Hematology and Oncology | Admitting: Hematology and Oncology

## 2024-03-28 VITALS — BP 123/78 | HR 77 | Temp 97.8°F | Wt 211.8 lb

## 2024-03-28 DIAGNOSIS — Z923 Personal history of irradiation: Secondary | ICD-10-CM | POA: Insufficient documentation

## 2024-03-28 DIAGNOSIS — Z170 Estrogen receptor positive status [ER+]: Secondary | ICD-10-CM | POA: Insufficient documentation

## 2024-03-28 DIAGNOSIS — C50411 Malignant neoplasm of upper-outer quadrant of right female breast: Secondary | ICD-10-CM | POA: Diagnosis not present

## 2024-03-28 DIAGNOSIS — M8580 Other specified disorders of bone density and structure, unspecified site: Secondary | ICD-10-CM | POA: Diagnosis not present

## 2024-03-28 DIAGNOSIS — Z79899 Other long term (current) drug therapy: Secondary | ICD-10-CM | POA: Diagnosis not present

## 2024-03-28 DIAGNOSIS — Z1732 Human epidermal growth factor receptor 2 negative status: Secondary | ICD-10-CM | POA: Insufficient documentation

## 2024-03-28 DIAGNOSIS — Z79811 Long term (current) use of aromatase inhibitors: Secondary | ICD-10-CM | POA: Insufficient documentation

## 2024-03-28 DIAGNOSIS — Z1721 Progesterone receptor positive status: Secondary | ICD-10-CM | POA: Diagnosis not present

## 2024-03-28 MED ORDER — ANASTROZOLE 1 MG PO TABS: 1.0000 mg | ORAL_TABLET | Freq: Every day | ORAL | 3 refills | Status: AC

## 2024-03-28 NOTE — Assessment & Plan Note (Signed)
 06/25/2018:Right lumpectomy: Grade 3 IDC, 0.9 cm, margins negative, 0/7 lymph nodes negative, ER 100%, PR 95%, HER-2 negative, Ki-67 80%, T1 BN 0 stage Ia Oncotype DX: 24, risk of recurrence at 9 years 10% Current treatment: Adjuvant radiation therapy started 07/27/2018   Treatment plan: Adjuvant antiestrogen therapy with anastrozole  1 mg daily x5 to 7 years started 08/22/2018   Anastrozole  toxicities: tolerating well.  Complains of fatigue and weight gain. Monitoring bone density closely.  Osteopenia noted on 06/2019 bone density.   Breast cancer surveillance: Mammogram left breast 07/13/2023 benign breast density category B  Breast exam 03/28/2024: Benign   Return to clinic in 1 year for follow-up

## 2024-03-28 NOTE — Progress Notes (Signed)
 "  Patient Care Team: Clarice Nottingham, MD as PCP - General (Internal Medicine) Ebbie Cough, MD as Consulting Physician (General Surgery) Odean Potts, MD as Consulting Physician (Hematology and Oncology) Shannon Agent, MD as Consulting Physician (Radiation Oncology)  DIAGNOSIS:  Encounter Diagnosis  Name Primary?   Malignant neoplasm of upper-outer quadrant of right breast in female, estrogen receptor positive (HCC) Yes    SUMMARY OF ONCOLOGIC HISTORY: Oncology History  Malignant neoplasm of upper-outer quadrant of right breast in female, estrogen receptor positive (HCC)  06/06/2018 Initial Diagnosis   Screening detected right breast mass at 9 o'clock position: 6 mm by ultrasound, axilla negative, grade 3 IDC, ER 100%, PR 95%, Ki-67 80%, HER-2 1+ negative, T1BN0 stage Ia clinical stage   06/13/2018 Cancer Staging   Staging form: Breast, AJCC 8th Edition - Clinical: Stage IA (cT1b, cN0, cM0, G3, ER+, PR+, HER2-) - Signed by Odean Potts, MD on 06/13/2018   06/25/2018 Surgery   Right lumpectomy: Grade 3 IDC, 0.9 cm, margins negative, 0/7 lymph nodes negative, ER 100%, PR 95%, HER-2 negative, Ki-67 80%, T1 BN 0 stage Ia   06/25/2018 Cancer Staging   Staging form: Breast, AJCC 8th Edition - Pathologic stage from 06/25/2018: Stage IA (pT1b, pN0, cM0, G3, ER+, PR+, HER2-, Oncotype DX score: 24) - Signed by Crawford Morna Pickle, NP on 11/28/2018   07/16/2018 Oncotype testing   Oncotype DX recurrence score 24, risk of recurrence 10% with hormone therapy   07/27/2018 -  Radiation Therapy   Adjuvant XRT   08/2018 -  Anti-estrogen oral therapy   Anastrozole  daily     CHIEF COMPLIANT: Follow-up on anastrozole  therapy  HISTORY OF PRESENT ILLNESS:   History of Present Illness Alexandra Powell is a 79 year old female with 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  She underwent right  breast lumpectomy with sentinel lymph node biopsy and adjuvant radiation in 2020 and has been on anastrozole  since June 2020, now in year six of a planned seven-year course. She denies new breast pain or discomfort. A fall last year caused right breast and arm ecchymosis without fracture on imaging and symptoms fully resolved.  She continues to tolerate anastrozole  without new or worsening adverse effects. She has stable mild arthralgias and no new vasomotor symptoms or other significant side effects.  She has osteopenia with a most recent T-score of -1.9 from last January. She remains physically active with regular walking and takes calcium  and vitamin D. She had two falls last year, one associated with transient sciatica that resolved to her baseline back pain. She has not had any fractures. Age and anastrozole  have been discussed as contributing factors to her bone loss.  She recently refilled her anastrozole  prescription.  Mar 28, 2023: Follow-up for ongoing anastrozole  therapy; patient reports no new complaints, recovered from COVID-19, and continues to tolerate anastrozole  with mild fatigue and weight gain. Breast exam benign, surveillance mammogram from July 2024 showed benign breast density. Osteopenia noted previously; new bone density scan ordered for monitoring. Plan to continue anastrozole  and annual follow-up.     ALLERGIES:  is allergic to erythromycin, keflex [cephalexin], cephalosporins, and lipitor  [atorvastatin ].  MEDICATIONS:  Current Outpatient Medications  Medication Sig Dispense Refill   acetaminophen  (TYLENOL ) 500 MG tablet Take 2 tablets (1,000 mg total) by mouth in the morning and at bedtime. 30 tablet 0   anastrozole  (ARIMIDEX ) 1 MG tablet Take 1 tablet (1 mg total) by mouth  daily. 90 tablet 3   calcium  carbonate (TUMS - DOSED IN MG ELEMENTAL CALCIUM ) 500 MG chewable tablet Chew 1 tablet by mouth daily.     cetirizine  (KLS ALLER-TEC) 10 MG tablet Take 1 tablet (10 mg  total) by mouth daily.     clopidogrel  (PLAVIX ) 75 MG tablet Take 1 tablet (75 mg total) by mouth daily. 30 tablet 11   co-enzyme Q-10 30 MG capsule Take 200 mg by mouth 2 (two) times daily.      EUTHYROX  75 MCG tablet 88 mcg.     Fe Bisgly-Vit C-Vit B12-FA 28-60-0.008-0.4 MG CAPS Take 1 tablet by mouth daily.     ketoconazole  (NIZORAL ) 2 % cream APPLY 1 APPLICATION TOPICALLY DAILY 30 g 1   magnesium oxide (MAG-OX) 400 MG tablet Take 400 mg by mouth daily.     Omega-3 Fatty Acids (FISH OIL) 500 MG CAPS Take 750 mg by mouth.      pantoprazole  (PROTONIX ) 40 MG tablet Take 40 mg by mouth daily. Delayed release     rosuvastatin  (CRESTOR ) 20 MG tablet Take 20 mg by mouth daily.     Vitamin D, Ergocalciferol, (DRISDOL) 1.25 MG (50000 UT) CAPS capsule Take 50,000 Units by mouth every 7 (seven) days. Pt takes on Friday of each week     No current facility-administered medications for this visit.    PHYSICAL EXAMINATION: ECOG PERFORMANCE STATUS: 1 - Symptomatic but completely ambulatory  Vitals:   03/28/24 1434  BP: 123/78  Pulse: 77  Temp: 97.8 F (36.6 C)  SpO2: 100%   Filed Weights   03/28/24 1434  Weight: 211 lb 12.8 oz (96.1 kg)    Physical Exam GENERAL: Normal appearance  (exam performed in the presence of a chaperone)  LABORATORY DATA:  I have reviewed the data as listed    Latest Ref Rng & Units 06/13/2018   12:11 PM 04/21/2018   11:02 AM 04/21/2018   10:57 AM  CMP  Glucose 70 - 99 mg/dL 899   897   BUN 8 - 23 mg/dL 17   14   Creatinine 9.55 - 1.00 mg/dL 8.98  9.09  9.07   Sodium 135 - 145 mmol/L 142   139   Potassium 3.5 - 5.1 mmol/L 4.4   3.8   Chloride 98 - 111 mmol/L 104   104   CO2 22 - 32 mmol/L 27   25   Calcium  8.9 - 10.3 mg/dL 9.6   9.0   Total Protein 6.5 - 8.1 g/dL 8.1   6.9   Total Bilirubin 0.3 - 1.2 mg/dL 0.5   0.5   Alkaline Phos 38 - 126 U/L 115   77   AST 15 - 41 U/L 28   22   ALT 0 - 44 U/L 30   24     Lab Results  Component Value Date   WBC 6.4  06/13/2018   HGB 13.2 06/13/2018   HCT 40.1 06/13/2018   MCV 88.7 06/13/2018   PLT 177 06/13/2018   NEUTROABS 3.6 06/13/2018    ASSESSMENT & PLAN:  Malignant neoplasm of upper-outer quadrant of right breast in female, estrogen receptor positive (HCC) 06/25/2018:Right lumpectomy: Grade 3 IDC, 0.9 cm, margins negative, 0/7 lymph nodes negative, ER 100%, PR 95%, HER-2 negative, Ki-67 80%, T1 BN 0 stage Ia Oncotype DX: 24, risk of recurrence at 9 years 10% Current treatment: Adjuvant radiation therapy started 07/27/2018   Treatment plan: Adjuvant antiestrogen therapy with anastrozole  1 mg daily x5  to 7 years started 08/22/2018   Anastrozole  toxicities: tolerating well.  Complains of fatigue and weight gain. Monitoring bone density closely.  Osteopenia noted on 06/2019 bone density.   Breast cancer surveillance: Mammogram left breast 07/13/2023 benign breast density category B  Breast exam 03/28/2024: Benign   Return to clinic in 1 year for follow-up      No orders of the defined types were placed in this encounter.  The patient has a good understanding of the overall plan. she agrees with it. she will call with any problems that may develop before the next visit here.  I personally spent a total of 30 minutes in the care of the patient today including preparing to see the patient, getting/reviewing separately obtained history, performing a medically appropriate exam/evaluation, counseling and educating, placing orders, referring and communicating with other health care professionals, documenting clinical information in the EHR, independently interpreting results, communicating results, and coordinating care.   Viinay K Korrina Zern, MD 03/28/2024    "

## 2024-04-04 ENCOUNTER — Ambulatory Visit: Admitting: Podiatry

## 2024-04-04 DIAGNOSIS — M19071 Primary osteoarthritis, right ankle and foot: Secondary | ICD-10-CM

## 2024-04-04 DIAGNOSIS — L84 Corns and callosities: Secondary | ICD-10-CM

## 2024-04-04 NOTE — Progress Notes (Signed)
 "   Chief Complaint  Patient presents with   Callouses    Right 5th toe, lateral side callus. Painful to walk. Pain also in the top of the right foot across the 1st and 2nd toe at the base of the toes where it meets the foot.  Not diabetic, Plavix ,    Discussed the use of AI scribe software for clinical note transcription with the patient, who gave verbal consent to proceed.  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  She reports a painful lesion on the lateral aspect of the right foot, consistent with a corn, which has been present for an extended period. She has previously removed similar lesions in other locations, but is unable to reach or remove the current lesion. She denies trauma, foreign body, or bleeding, and has not noted swelling or other changes. She is uncertain if the lesion is a callus or if there is something embedded within it.  She also describes pain across the dorsum of the right foot, centered at the first metatarsophalangeal joint. The pain is exacerbated by wearing tennis shoes, particularly during ambulation, and does not improve with continued activity. Regular shoes do not cause discomfort. At its worst, the pain required use of a cane for offloading. The pain improves with rest but recurs with use of tennis shoes. She uses orthotics and arch supports, and finds older shoes most comfortable. She does not experience pain with direct pressure on the joint, but notes a mild pulling sensation with movement. She has used topical Voltaren gel for joint pain in other locations with benefit.  She has attempted to maintain activity at the gym but is unable to wear tennis shoes due to foot pain, and has been permitted to use more comfortable shoes during exercise.     Past  Medical History:  Diagnosis Date   Breast cancer (HCC) 2020   Right Breast   Cancer (HCC) 05/2018    right breast IDC   GERD (gastroesophageal reflux disease)    Hypothyroidism (acquired)    Personal history of radiation therapy 2020   Right Breast Cancer   Pre-diabetes    Stroke (HCC) 04/2018   do deficits   Vitamin D deficiency    Past Surgical History:  Procedure Laterality Date   ABDOMINAL HYSTERECTOMY     BREAST LUMPECTOMY Right 06/25/2018   BREAST LUMPECTOMY WITH RADIOACTIVE SEED AND SENTINEL LYMPH NODE BIOPSY Right 06/25/2018   Procedure: RIGHT BREAST LUMPECTOMY WITH RADIOACTIVE SEED AND RIGHT AXILLARY SENTINEL LYMPH NODE BIOPSY;  Surgeon: Ebbie Cough, MD;  Location: [facility];  Service: General;  Laterality: Right;   HYSTERECTOMY ABDOMINAL WITH SALPINGO-OOPHORECTOMY     remote ovarian cancer   TONSILLECTOMY     Allergies[1]  Physical Exam MUSCULOSKELETAL: Mild pain with range of motion of the first MPJ right foot, no pain on palpation across dorsal joint. Prominent bone on top of foot at first MPJ, indicative of arthritic changes. SKIN: Corn located right submet 5 with pain on palpation.  No evidence of foreign body is noted.  No warty tissue was identified. Palpable pedal pulses noted.   Results Paring of corn (paring of hyperkeratotic lesion) Paring of hyperkeratotic lesion on right lateral foot with sterile #313 blade. Central core removed. No foreign body or underlying abnormality present. Topical  Salinocaine medication applied to site.    Assessment/Plan of Care: 1. Corns   2. Arthritis of first metatarsophalangeal (MTP) joint of right foot    Assessment & Plan Corn of right lateral foot Confirmed corn on the right lateral foot after paring, with no evidence of foreign body or underlying abnormality. Lesion likely secondary to altered weight-bearing from compensatory gait due to right first MTP joint osteoarthritis. Recurrence remains  possible if lateral foot pressure persists. - Shaved the corn to remove the central core and confirm diagnosis. - Applied topical medication to the site (Salinocaine) to discourage recurrence. - Advised to keep Band-Aid on until bedtime to facilitate medication penetration. - Recommended over-the-counter callus cream (e.g., Carousel or similar) from pharmacy foot care section, with guidance to consult pharmacist if needed. - Provided education regarding risk of recurrence if lateral foot pressure continues.  Osteoarthritis of right first metatarsophalangeal joint Chronic osteoarthritis of the right first MTP joint with prominent dorsal osteophyte and pain exacerbated by movement and weight-bearing, particularly in sneakers. Symptoms have resulted in compensatory gait changes. Current orthotics and arch supports provide incomplete relief. Non-surgical management options discussed to limit painful joint motion and improve comfort. - Discussed use of stiffer extension (Morton's extension) in orthotics to limit joint motion. - Recommended consultation with [employer] store for orthotic modification to add Morton's extension; provided written instructions for specific modification. - Recommended trial of PowerStep over-the-counter insert with built-in Morton's extension, available from manufacturer website. - Instructed to remove original insole from sneakers and replace with PowerStep insert. - Discussed use of topical Voltaren gel for symptomatic relief, especially on dorsal aspect of joint for improved absorption. - Advised to contact office if symptoms acutely worsen or if she desires corticosteroid injection for severe flare-ups.   Awanda CHARM Imperial, DPM, FACFAS [facility]     [address] [place], [place] [address]                Office [phone]  Fax [phone]      [1]  Allergies Allergen Reactions   Erythromycin      stomach on fire   Keflex [Cephalexin] Nausea Only   Cephalosporins Rash   Lipitor  [Atorvastatin ] Other (See Comments)    Joint acheness   "

## 2025-03-27 ENCOUNTER — Inpatient Hospital Stay: Admitting: Hematology and Oncology
